# Patient Record
Sex: Male | Born: 1955 | Race: Black or African American | Hispanic: No | Marital: Married | Smoking: Never smoker
Health system: Southern US, Community
[De-identification: ages and names within clinical notes are randomized; demographics above are authoritative.]

## PROBLEM LIST (undated history)

## (undated) DIAGNOSIS — K219 Gastro-esophageal reflux disease without esophagitis: Secondary | ICD-10-CM

## (undated) DIAGNOSIS — M1990 Unspecified osteoarthritis, unspecified site: Secondary | ICD-10-CM

## (undated) DIAGNOSIS — M545 Low back pain, unspecified: Secondary | ICD-10-CM

## (undated) DIAGNOSIS — G8929 Other chronic pain: Secondary | ICD-10-CM

## (undated) DIAGNOSIS — H409 Unspecified glaucoma: Secondary | ICD-10-CM

## (undated) DIAGNOSIS — R7303 Prediabetes: Secondary | ICD-10-CM

## (undated) DIAGNOSIS — I499 Cardiac arrhythmia, unspecified: Secondary | ICD-10-CM

## (undated) DIAGNOSIS — D649 Anemia, unspecified: Secondary | ICD-10-CM

## (undated) DIAGNOSIS — C61 Malignant neoplasm of prostate: Secondary | ICD-10-CM

## (undated) DIAGNOSIS — G4730 Sleep apnea, unspecified: Secondary | ICD-10-CM

## (undated) DIAGNOSIS — I10 Essential (primary) hypertension: Secondary | ICD-10-CM

## (undated) HISTORY — PX: EYE SURGERY: SHX253

## (undated) HISTORY — PX: COLONOSCOPY: SHX174

---

## 1988-12-06 HISTORY — PX: VASECTOMY: SHX75

## 1988-12-06 HISTORY — PX: KNEE ARTHROSCOPY: SUR90

## 2012-04-07 HISTORY — PX: PROSTATECTOMY: SHX69

## 2013-06-06 ENCOUNTER — Other Ambulatory Visit: Payer: Self-pay | Admitting: Orthopedic Surgery

## 2013-06-06 ENCOUNTER — Encounter (HOSPITAL_COMMUNITY): Payer: Self-pay | Admitting: Pharmacy Technician

## 2013-06-09 ENCOUNTER — Encounter (HOSPITAL_COMMUNITY): Payer: Self-pay

## 2013-06-09 ENCOUNTER — Ambulatory Visit (HOSPITAL_COMMUNITY)
Admission: RE | Admit: 2013-06-09 | Discharge: 2013-06-09 | Disposition: A | Discharge status: Home / Self Care | Payer: Federal, State, Local not specified - PPO | Source: Ambulatory Visit | Attending: Orthopedic Surgery | Admitting: Orthopedic Surgery

## 2013-06-09 ENCOUNTER — Encounter (INDEPENDENT_AMBULATORY_CARE_PROVIDER_SITE_OTHER): Payer: Self-pay

## 2013-06-09 ENCOUNTER — Encounter (HOSPITAL_COMMUNITY)
Admission: RE | Admit: 2013-06-09 | Discharge: 2013-06-09 | Disposition: A | Discharge status: Home / Self Care | Payer: Federal, State, Local not specified - PPO | Source: Ambulatory Visit | Attending: Orthopedic Surgery | Admitting: Orthopedic Surgery

## 2013-06-09 ENCOUNTER — Other Ambulatory Visit (HOSPITAL_COMMUNITY): Payer: Self-pay | Admitting: *Deleted

## 2013-06-09 DIAGNOSIS — Z01812 Encounter for preprocedural laboratory examination: Secondary | ICD-10-CM | POA: Insufficient documentation

## 2013-06-09 DIAGNOSIS — Z01810 Encounter for preprocedural cardiovascular examination: Secondary | ICD-10-CM | POA: Insufficient documentation

## 2013-06-09 DIAGNOSIS — Z01818 Encounter for other preprocedural examination: Secondary | ICD-10-CM | POA: Insufficient documentation

## 2013-06-09 HISTORY — DX: Essential (primary) hypertension: I10

## 2013-06-09 HISTORY — DX: Cardiac arrhythmia, unspecified: I49.9

## 2013-06-09 HISTORY — DX: Unspecified osteoarthritis, unspecified site: M19.90

## 2013-06-09 HISTORY — DX: Gastro-esophageal reflux disease without esophagitis: K21.9

## 2013-06-09 HISTORY — DX: Sleep apnea, unspecified: G47.30

## 2013-06-09 LAB — CBC WITH DIFFERENTIAL/PLATELET
Basophils Absolute: 0 10*3/uL (ref 0.0–0.1)
Basophils Relative: 0 % (ref 0–1)
Eosinophils Absolute: 0.1 10*3/uL (ref 0.0–0.7)
Eosinophils Relative: 1 % (ref 0–5)
HCT: 45.1 % (ref 39.0–52.0)
Hemoglobin: 15.2 g/dL (ref 13.0–17.0)
LYMPHS ABS: 1.6 10*3/uL (ref 0.7–4.0)
LYMPHS PCT: 33 % (ref 12–46)
MCH: 30.1 pg (ref 26.0–34.0)
MCHC: 33.7 g/dL (ref 30.0–36.0)
MCV: 89.3 fL (ref 78.0–100.0)
Monocytes Absolute: 0.5 10*3/uL (ref 0.1–1.0)
Monocytes Relative: 10 % (ref 3–12)
NEUTROS PCT: 56 % (ref 43–77)
Neutro Abs: 2.7 10*3/uL (ref 1.7–7.7)
PLATELETS: 231 10*3/uL (ref 150–400)
RBC: 5.05 MIL/uL (ref 4.22–5.81)
RDW: 14.7 % (ref 11.5–15.5)
WBC: 4.8 10*3/uL (ref 4.0–10.5)

## 2013-06-09 LAB — TYPE AND SCREEN
ABO/RH(D): A NEG
Antibody Screen: NEGATIVE

## 2013-06-09 LAB — URINALYSIS, ROUTINE W REFLEX MICROSCOPIC
BILIRUBIN URINE: NEGATIVE
Glucose, UA: NEGATIVE mg/dL
Hgb urine dipstick: NEGATIVE
Ketones, ur: NEGATIVE mg/dL
LEUKOCYTES UA: NEGATIVE
NITRITE: NEGATIVE
PH: 7.5 (ref 5.0–8.0)
PROTEIN: NEGATIVE mg/dL
Specific Gravity, Urine: 1.025 (ref 1.005–1.030)
UROBILINOGEN UA: 0.2 mg/dL (ref 0.0–1.0)

## 2013-06-09 LAB — COMPREHENSIVE METABOLIC PANEL
ALK PHOS: 47 U/L (ref 39–117)
ALT: 21 U/L (ref 0–53)
AST: 24 U/L (ref 0–37)
Albumin: 4.1 g/dL (ref 3.5–5.2)
BUN: 11 mg/dL (ref 6–23)
CO2: 27 meq/L (ref 19–32)
Calcium: 9.6 mg/dL (ref 8.4–10.5)
Chloride: 101 mEq/L (ref 96–112)
Creatinine, Ser: 0.93 mg/dL (ref 0.50–1.35)
GLUCOSE: 84 mg/dL (ref 70–99)
POTASSIUM: 3.5 meq/L — AB (ref 3.7–5.3)
Sodium: 141 mEq/L (ref 137–147)
Total Bilirubin: 0.3 mg/dL (ref 0.3–1.2)
Total Protein: 7.3 g/dL (ref 6.0–8.3)

## 2013-06-09 LAB — SURGICAL PCR SCREEN
MRSA, PCR: NEGATIVE
Staphylococcus aureus: NEGATIVE

## 2013-06-09 LAB — ABO/RH: ABO/RH(D): A NEG

## 2013-06-09 LAB — PROTIME-INR
INR: 0.96 (ref 0.00–1.49)
Prothrombin Time: 12.6 seconds (ref 11.6–15.2)

## 2013-06-09 LAB — APTT: aPTT: 34 seconds (ref 24–37)

## 2013-06-09 NOTE — Pre-Procedure Instructions (Signed)
Paul Padilla  06/09/2013   Your procedure is scheduled on:  Wednesday, June 15, 2013 at 8:30 AM.   Report to [facility] Entrance "A" Admitting Office at 6:30 AM.   Call this number if you have problems the morning of surgery: [phone]   Remember:   Do not eat food or drink liquids after midnight Tuesday, 06/14/13.   Take these medicines the morning of surgery with A SIP OF WATER: gabapentin (NEURONTIN)   Stop Celebrex as of today.   Do not wear jewelry.  Do not wear lotions, powders, or cologne. You may wear deodorant.  Men may shave face and neck.  Do not bring valuables to the hospital.  [facility] is not responsible                  for any belongings or valuables.               Contacts, dentures or bridgework may not be worn into surgery.  Leave suitcase in the car. After surgery it may be brought to your room.  For patients admitted to the hospital, discharge time is determined by your                treatment team.           Special Instructions: [facility] - Preparing for Surgery  Before surgery, you can play an important role.  Because skin is not sterile, your skin needs to be as free of germs as possible.  You can reduce the number of germs on you skin by washing with CHG (chlorahexidine gluconate) soap before surgery.  CHG is an antiseptic cleaner which kills germs and bonds with the skin to continue killing germs even after washing.  Please DO NOT use if you have an allergy to CHG or antibacterial soaps.  If your skin becomes reddened/irritated stop using the CHG and inform your nurse when you arrive at Short Stay.  Do not shave (including legs and underarms) for at least 48 hours prior to the first CHG shower.  You may shave your face.  Please follow these instructions carefully:   1.  Shower with CHG Soap the night before surgery and the                                morning of Surgery.  2.  If you choose to wash your hair, wash your hair first as  usual with your       normal shampoo.  3.  After you shampoo, rinse your hair and body thoroughly to remove the                      Shampoo.  4.  Use CHG as you would any other liquid soap.  You can apply chg directly       to the skin and wash gently with scrungie or a clean washcloth.  5.  Apply the CHG Soap to your body ONLY FROM THE NECK DOWN.        Do not use on open wounds or open sores.  Avoid contact with your eyes, ears, mouth and genitals (private parts).  Wash genitals (private parts) with your normal soap.  6.  Wash thoroughly, paying special attention to the area where your surgery        will be performed.  7.  Thoroughly rinse your body with warm  water from the neck down.  8.  DO NOT shower/wash with your normal soap after using and rinsing off       the CHG Soap.  9.  Pat yourself dry with a clean towel.            10.  Wear clean pajamas.            11.  Place clean sheets on your bed the night of your first shower and do not        sleep with pets.  Day of Surgery  Do not apply any lotions the morning of surgery.  Please wear clean clothes to the hospital/surgery center.     Please read over the following fact sheets that you were given: Pain Booklet, Coughing and Deep Breathing, Blood Transfusion Information, MRSA Information and Surgical Site Infection Prevention

## 2013-06-09 NOTE — Progress Notes (Signed)
Pt concerned about possibility of having to have a urinary catheter. He has had his prostate removed and has been told that having a catheter inserted would not be a problem, but he is still concerned.

## 2013-06-14 MED ORDER — CEFAZOLIN SODIUM-DEXTROSE 2-3 GM-% IV SOLR
2.0000 g | INTRAVENOUS | Status: AC
  Administered 2013-06-15 (×2): 2 g via INTRAVENOUS
  Filled 2013-06-14: qty 50

## 2013-06-15 ENCOUNTER — Inpatient Hospital Stay (HOSPITAL_COMMUNITY)
Admission: RE | Admit: 2013-06-15 | Discharge: 2013-06-18 | DRG: 460 | Disposition: A | Discharge status: Home / Self Care | Payer: Federal, State, Local not specified - PPO | Source: Ambulatory Visit | Attending: Orthopedic Surgery | Admitting: Orthopedic Surgery

## 2013-06-15 ENCOUNTER — Encounter (HOSPITAL_COMMUNITY): Payer: Self-pay | Admitting: Surgery

## 2013-06-15 ENCOUNTER — Inpatient Hospital Stay (HOSPITAL_COMMUNITY): Payer: Federal, State, Local not specified - PPO

## 2013-06-15 ENCOUNTER — Encounter (HOSPITAL_COMMUNITY)
Admission: RE | Disposition: A | Discharge status: Home / Self Care | Payer: Federal, State, Local not specified - PPO | Source: Ambulatory Visit | Attending: Orthopedic Surgery

## 2013-06-15 ENCOUNTER — Inpatient Hospital Stay (HOSPITAL_COMMUNITY): Payer: Federal, State, Local not specified - PPO | Admitting: Anesthesiology

## 2013-06-15 ENCOUNTER — Encounter (HOSPITAL_COMMUNITY): Payer: Federal, State, Local not specified - PPO | Admitting: Anesthesiology

## 2013-06-15 DIAGNOSIS — M48061 Spinal stenosis, lumbar region without neurogenic claudication: Principal | ICD-10-CM | POA: Diagnosis present

## 2013-06-15 DIAGNOSIS — G4730 Sleep apnea, unspecified: Secondary | ICD-10-CM | POA: Diagnosis present

## 2013-06-15 DIAGNOSIS — Z8042 Family history of malignant neoplasm of prostate: Secondary | ICD-10-CM

## 2013-06-15 DIAGNOSIS — Z8249 Family history of ischemic heart disease and other diseases of the circulatory system: Secondary | ICD-10-CM

## 2013-06-15 DIAGNOSIS — Q762 Congenital spondylolisthesis: Secondary | ICD-10-CM

## 2013-06-15 DIAGNOSIS — M129 Arthropathy, unspecified: Secondary | ICD-10-CM | POA: Diagnosis present

## 2013-06-15 DIAGNOSIS — M5410 Radiculopathy, site unspecified: Secondary | ICD-10-CM | POA: Diagnosis present

## 2013-06-15 DIAGNOSIS — I10 Essential (primary) hypertension: Secondary | ICD-10-CM | POA: Diagnosis present

## 2013-06-15 DIAGNOSIS — Z8546 Personal history of malignant neoplasm of prostate: Secondary | ICD-10-CM

## 2013-06-15 DIAGNOSIS — K219 Gastro-esophageal reflux disease without esophagitis: Secondary | ICD-10-CM | POA: Diagnosis present

## 2013-06-15 HISTORY — DX: Malignant neoplasm of prostate: C61

## 2013-06-15 HISTORY — DX: Other chronic pain: G89.29

## 2013-06-15 HISTORY — DX: Low back pain, unspecified: M54.50

## 2013-06-15 HISTORY — DX: Low back pain: M54.5

## 2013-06-15 HISTORY — PX: POSTERIOR LAMINECTOMY / DECOMPRESSION LUMBAR SPINE: SUR740

## 2013-06-15 SURGERY — POSTERIOR LUMBAR FUSION 1 LEVEL
Anesthesia: General | Site: Spine Lumbar | Laterality: Left

## 2013-06-15 MED ORDER — BUPIVACAINE-EPINEPHRINE 0.25% -1:200000 IJ SOLN
INTRAMUSCULAR | Status: DC | PRN
  Administered 2013-06-15: 25 mL

## 2013-06-15 MED ORDER — METHYLENE BLUE 1 % INJ SOLN
INTRAMUSCULAR | Status: AC
Start: 2013-06-15 — End: 2013-06-15
  Filled 2013-06-15: qty 10

## 2013-06-15 MED ORDER — BUPIVACAINE-EPINEPHRINE (PF) 0.25% -1:200000 IJ SOLN
INTRAMUSCULAR | Status: AC
  Filled 2013-06-15: qty 30

## 2013-06-15 MED ORDER — FENTANYL CITRATE 0.05 MG/ML IJ SOLN
INTRAMUSCULAR | Status: DC | PRN
  Administered 2013-06-15 (×4): 50 ug via INTRAVENOUS
  Administered 2013-06-15: 150 ug via INTRAVENOUS

## 2013-06-15 MED ORDER — MIDAZOLAM HCL 5 MG/5ML IJ SOLN
INTRAMUSCULAR | Status: DC | PRN
  Administered 2013-06-15: 2 mg via INTRAVENOUS

## 2013-06-15 MED ORDER — 0.9 % SODIUM CHLORIDE (POUR BTL) OPTIME
TOPICAL | Status: DC | PRN
  Administered 2013-06-15 (×4): 1000 mL

## 2013-06-15 MED ORDER — ARTIFICIAL TEARS OP OINT
TOPICAL_OINTMENT | OPHTHALMIC | Status: DC | PRN
  Administered 2013-06-15: 1 via OPHTHALMIC

## 2013-06-15 MED ORDER — PHENYLEPHRINE HCL 10 MG/ML IJ SOLN
INTRAMUSCULAR | Status: DC | PRN
  Administered 2013-06-15 (×5): 80 ug via INTRAVENOUS

## 2013-06-15 MED ORDER — LIDOCAINE HCL (CARDIAC) 20 MG/ML IV SOLN
INTRAVENOUS | Status: AC
  Filled 2013-06-15: qty 5

## 2013-06-15 MED ORDER — SENNOSIDES-DOCUSATE SODIUM 8.6-50 MG PO TABS
1.0000 | ORAL_TABLET | Freq: Every evening | ORAL | Status: DC | PRN
  Administered 2013-06-16 – 2013-06-17 (×2): 1 via ORAL
  Filled 2013-06-15 (×2): qty 1

## 2013-06-15 MED ORDER — NEOSTIGMINE METHYLSULFATE 1 MG/ML IJ SOLN
INTRAMUSCULAR | Status: DC | PRN
  Administered 2013-06-15: 2 mg via INTRAVENOUS

## 2013-06-15 MED ORDER — THROMBIN 20000 UNITS EX SOLR
CUTANEOUS | Status: DC | PRN
  Administered 2013-06-15: 10:00:00

## 2013-06-15 MED ORDER — SODIUM CHLORIDE 0.9 % IJ SOLN
9.0000 mL | INTRAMUSCULAR | Status: DC | PRN
Start: 2013-06-15 — End: 2013-06-16

## 2013-06-15 MED ORDER — ONDANSETRON HCL 4 MG/2ML IJ SOLN
INTRAMUSCULAR | Status: AC
  Filled 2013-06-15: qty 2

## 2013-06-15 MED ORDER — AMLODIPINE BESYLATE 5 MG PO TABS
5.0000 mg | ORAL_TABLET | Freq: Every day | ORAL | Status: DC
  Administered 2013-06-16 – 2013-06-17 (×2): 5 mg via ORAL
  Filled 2013-06-15 (×3): qty 1

## 2013-06-15 MED ORDER — VECURONIUM BROMIDE 10 MG IV SOLR
INTRAVENOUS | Status: DC | PRN
  Administered 2013-06-15 (×2): 1 mg via INTRAVENOUS
  Administered 2013-06-15 (×2): 2 mg via INTRAVENOUS

## 2013-06-15 MED ORDER — HYDROMORPHONE HCL PF 1 MG/ML IJ SOLN
INTRAMUSCULAR | Status: AC
  Filled 2013-06-15: qty 1

## 2013-06-15 MED ORDER — LIDOCAINE HCL (CARDIAC) 20 MG/ML IV SOLN
INTRAVENOUS | Status: DC | PRN
  Administered 2013-06-15: 100 mg via INTRAVENOUS

## 2013-06-15 MED ORDER — OXYCODONE HCL 5 MG PO TABS
ORAL_TABLET | ORAL | Status: AC
  Administered 2013-06-15: 5 mg
  Filled 2013-06-15: qty 1

## 2013-06-15 MED ORDER — NALOXONE HCL 0.4 MG/ML IJ SOLN: 0.4000 mg | INTRAMUSCULAR | Status: DC | PRN

## 2013-06-15 MED ORDER — PROMETHAZINE HCL 25 MG/ML IJ SOLN: 6.2500 mg | INTRAMUSCULAR | Status: DC | PRN

## 2013-06-15 MED ORDER — DIAZEPAM 5 MG PO TABS
5.0000 mg | ORAL_TABLET | Freq: Four times a day (QID) | ORAL | Status: DC | PRN
  Administered 2013-06-15 – 2013-06-16 (×3): 5 mg via ORAL
  Filled 2013-06-15 (×3): qty 1

## 2013-06-15 MED ORDER — CEFAZOLIN SODIUM 1-5 GM-% IV SOLN
1.0000 g | Freq: Three times a day (TID) | INTRAVENOUS | Status: AC
  Administered 2013-06-15 – 2013-06-16 (×2): 1 g via INTRAVENOUS
  Filled 2013-06-15 (×2): qty 50

## 2013-06-15 MED ORDER — HYDROCHLOROTHIAZIDE 12.5 MG PO CAPS
12.5000 mg | ORAL_CAPSULE | Freq: Every day | ORAL | Status: DC
  Administered 2013-06-16 – 2013-06-18 (×3): 12.5 mg via ORAL
  Filled 2013-06-15 (×3): qty 1

## 2013-06-15 MED ORDER — SODIUM CHLORIDE 0.9 % IJ SOLN: 3.0000 mL | INTRAMUSCULAR | Status: DC | PRN

## 2013-06-15 MED ORDER — ROCURONIUM BROMIDE 100 MG/10ML IV SOLN
INTRAVENOUS | Status: DC | PRN
  Administered 2013-06-15: 50 mg via INTRAVENOUS

## 2013-06-15 MED ORDER — IRBESARTAN 150 MG PO TABS
150.0000 mg | ORAL_TABLET | Freq: Every day | ORAL | Status: DC
Start: 2013-06-16 — End: 2013-06-18
  Administered 2013-06-16 – 2013-06-18 (×3): 150 mg via ORAL
  Filled 2013-06-15 (×3): qty 1

## 2013-06-15 MED ORDER — PROPOFOL 10 MG/ML IV BOLUS
INTRAVENOUS | Status: AC
  Filled 2013-06-15: qty 20

## 2013-06-15 MED ORDER — MIDAZOLAM HCL 2 MG/2ML IJ SOLN
INTRAMUSCULAR | Status: AC
  Filled 2013-06-15: qty 2

## 2013-06-15 MED ORDER — GABAPENTIN 300 MG PO CAPS
300.0000 mg | ORAL_CAPSULE | Freq: Two times a day (BID) | ORAL | Status: DC
  Administered 2013-06-15 – 2013-06-18 (×6): 300 mg via ORAL
  Filled 2013-06-15 (×7): qty 1

## 2013-06-15 MED ORDER — MENTHOL 3 MG MT LOZG: 1.0000 | LOZENGE | OROMUCOSAL | Status: DC | PRN

## 2013-06-15 MED ORDER — PHENYLEPHRINE 40 MCG/ML (10ML) SYRINGE FOR IV PUSH (FOR BLOOD PRESSURE SUPPORT)
PREFILLED_SYRINGE | INTRAVENOUS | Status: AC
  Filled 2013-06-15: qty 10

## 2013-06-15 MED ORDER — LACTATED RINGERS IV SOLN
INTRAVENOUS | Status: DC | PRN
  Administered 2013-06-15: 09:00:00 via INTRAVENOUS

## 2013-06-15 MED ORDER — GLYCOPYRROLATE 0.2 MG/ML IJ SOLN
INTRAMUSCULAR | Status: AC
  Filled 2013-06-15: qty 1

## 2013-06-15 MED ORDER — OXYCODONE HCL 5 MG/5ML PO SOLN: 5.0000 mg | Freq: Once | ORAL | Status: DC | PRN

## 2013-06-15 MED ORDER — METHYLENE BLUE 1 % INJ SOLN
INTRAMUSCULAR | Status: DC | PRN
  Administered 2013-06-15: .5 mL

## 2013-06-15 MED ORDER — ALBUMIN HUMAN 5 % IV SOLN
INTRAVENOUS | Status: DC | PRN
  Administered 2013-06-15: 11:00:00 via INTRAVENOUS

## 2013-06-15 MED ORDER — SODIUM CHLORIDE 0.9 % IV SOLN
INTRAVENOUS | Status: DC | PRN
  Administered 2013-06-15: 12:00:00 via INTRAVENOUS

## 2013-06-15 MED ORDER — GLYCOPYRROLATE 0.2 MG/ML IJ SOLN
INTRAMUSCULAR | Status: DC | PRN
  Administered 2013-06-15: 0.3 mg via INTRAVENOUS

## 2013-06-15 MED ORDER — DIPHENHYDRAMINE HCL 50 MG/ML IJ SOLN: 12.5000 mg | Freq: Four times a day (QID) | INTRAMUSCULAR | Status: DC | PRN

## 2013-06-15 MED ORDER — FENTANYL CITRATE 0.05 MG/ML IJ SOLN
INTRAMUSCULAR | Status: AC
  Filled 2013-06-15: qty 5

## 2013-06-15 MED ORDER — MORPHINE SULFATE 2 MG/ML IJ SOLN
1.0000 mg | INTRAMUSCULAR | Status: DC | PRN
  Administered 2013-06-16: 2 mg via INTRAVENOUS
  Filled 2013-06-15: qty 1

## 2013-06-15 MED ORDER — DIPHENHYDRAMINE HCL 12.5 MG/5ML PO ELIX: 12.5000 mg | ORAL_SOLUTION | Freq: Four times a day (QID) | ORAL | Status: DC | PRN

## 2013-06-15 MED ORDER — SODIUM CHLORIDE 0.9 % IV SOLN: 250.0000 mL | INTRAVENOUS | Status: DC

## 2013-06-15 MED ORDER — ALUM & MAG HYDROXIDE-SIMETH 200-200-20 MG/5ML PO SUSP: 30.0000 mL | Freq: Four times a day (QID) | ORAL | Status: DC | PRN

## 2013-06-15 MED ORDER — PHENOL 1.4 % MT LIQD: 1.0000 | OROMUCOSAL | Status: DC | PRN

## 2013-06-15 MED ORDER — MORPHINE SULFATE (PF) 1 MG/ML IV SOLN
INTRAVENOUS | Status: DC
  Administered 2013-06-15: 7.5 mg via INTRAVENOUS
  Administered 2013-06-15: 15:00:00 via INTRAVENOUS
  Administered 2013-06-16: 6 mg via INTRAVENOUS
  Administered 2013-06-16: 01:00:00 via INTRAVENOUS
  Administered 2013-06-16: 14.71 mg via INTRAVENOUS
  Filled 2013-06-15: qty 25

## 2013-06-15 MED ORDER — POVIDONE-IODINE 7.5 % EX SOLN: Freq: Once | CUTANEOUS | Status: DC

## 2013-06-15 MED ORDER — ARTIFICIAL TEARS OP OINT
TOPICAL_OINTMENT | OPHTHALMIC | Status: AC
  Filled 2013-06-15: qty 3.5

## 2013-06-15 MED ORDER — PROPOFOL 10 MG/ML IV BOLUS
INTRAVENOUS | Status: DC | PRN
  Administered 2013-06-15: 200 mg via INTRAVENOUS
  Administered 2013-06-15: 20 mg via INTRAVENOUS

## 2013-06-15 MED ORDER — HYDROMORPHONE HCL PF 1 MG/ML IJ SOLN
0.2500 mg | INTRAMUSCULAR | Status: DC | PRN
  Administered 2013-06-15 (×2): 0.5 mg via INTRAVENOUS

## 2013-06-15 MED ORDER — SODIUM CHLORIDE 0.9 % IV SOLN
INTRAVENOUS | Status: DC
  Administered 2013-06-15 – 2013-06-17 (×2): via INTRAVENOUS

## 2013-06-15 MED ORDER — SUCCINYLCHOLINE CHLORIDE 20 MG/ML IJ SOLN
INTRAMUSCULAR | Status: AC
  Filled 2013-06-15: qty 1

## 2013-06-15 MED ORDER — PROPOFOL INFUSION 10 MG/ML OPTIME
INTRAVENOUS | Status: DC | PRN
  Administered 2013-06-15: 50 ug/kg/min via INTRAVENOUS

## 2013-06-15 MED ORDER — ONDANSETRON HCL 4 MG/2ML IJ SOLN: 4.0000 mg | Freq: Four times a day (QID) | INTRAMUSCULAR | Status: DC | PRN

## 2013-06-15 MED ORDER — ACETAMINOPHEN 325 MG PO TABS
650.0000 mg | ORAL_TABLET | ORAL | Status: DC | PRN
  Administered 2013-06-17 – 2013-06-18 (×5): 650 mg via ORAL
  Filled 2013-06-15 (×5): qty 2

## 2013-06-15 MED ORDER — DOCUSATE SODIUM 100 MG PO CAPS
100.0000 mg | ORAL_CAPSULE | Freq: Two times a day (BID) | ORAL | Status: DC
  Administered 2013-06-15 – 2013-06-18 (×6): 100 mg via ORAL
  Filled 2013-06-15 (×7): qty 1

## 2013-06-15 MED ORDER — STERILE WATER FOR INJECTION IJ SOLN
INTRAMUSCULAR | Status: AC
  Filled 2013-06-15: qty 10

## 2013-06-15 MED ORDER — ROCURONIUM BROMIDE 50 MG/5ML IV SOLN
INTRAVENOUS | Status: AC
  Filled 2013-06-15: qty 1

## 2013-06-15 MED ORDER — PHENYLEPHRINE HCL 10 MG/ML IJ SOLN
10.0000 mg | INTRAVENOUS | Status: DC | PRN
  Administered 2013-06-15: 25 ug/min via INTRAVENOUS

## 2013-06-15 MED ORDER — MORPHINE SULFATE (PF) 1 MG/ML IV SOLN
INTRAVENOUS | Status: AC
  Filled 2013-06-15: qty 25

## 2013-06-15 MED ORDER — THROMBIN 20000 UNITS EX SOLR
CUTANEOUS | Status: AC
  Filled 2013-06-15: qty 20000

## 2013-06-15 MED ORDER — SODIUM CHLORIDE 0.9 % IJ SOLN
INTRAMUSCULAR | Status: AC
  Filled 2013-06-15: qty 10

## 2013-06-15 MED ORDER — ONDANSETRON HCL 4 MG/2ML IJ SOLN
INTRAMUSCULAR | Status: DC | PRN
  Administered 2013-06-15: 4 mg via INTRAVENOUS

## 2013-06-15 MED ORDER — FLEET ENEMA 7-19 GM/118ML RE ENEM: 1.0000 | ENEMA | Freq: Once | RECTAL | Status: AC | PRN

## 2013-06-15 MED ORDER — EPHEDRINE SULFATE 50 MG/ML IJ SOLN
INTRAMUSCULAR | Status: AC
  Filled 2013-06-15: qty 1

## 2013-06-15 MED ORDER — OXYCODONE-ACETAMINOPHEN 5-325 MG PO TABS
1.0000 | ORAL_TABLET | ORAL | Status: DC | PRN
  Administered 2013-06-16 – 2013-06-17 (×2): 2 via ORAL
  Administered 2013-06-18: 1 via ORAL
  Filled 2013-06-15: qty 2
  Filled 2013-06-15: qty 1
  Filled 2013-06-15: qty 2
  Filled 2013-06-15: qty 1
  Filled 2013-06-15: qty 2

## 2013-06-15 MED ORDER — ONDANSETRON HCL 4 MG/2ML IJ SOLN: 4.0000 mg | INTRAMUSCULAR | Status: DC | PRN

## 2013-06-15 MED ORDER — LACTATED RINGERS IV SOLN
INTRAVENOUS | Status: DC | PRN
  Administered 2013-06-15 (×2): via INTRAVENOUS

## 2013-06-15 MED ORDER — OLMESARTAN-AMLODIPINE-HCTZ 20-5-12.5 MG PO TABS: 1.0000 | ORAL_TABLET | Freq: Every day | ORAL | Status: DC

## 2013-06-15 MED ORDER — OXYCODONE HCL 5 MG PO TABS: 5.0000 mg | ORAL_TABLET | Freq: Once | ORAL | Status: DC | PRN

## 2013-06-15 MED ORDER — ACETAMINOPHEN 650 MG RE SUPP: 650.0000 mg | RECTAL | Status: DC | PRN

## 2013-06-15 MED ORDER — VECURONIUM BROMIDE 10 MG IV SOLR
INTRAVENOUS | Status: AC
  Filled 2013-06-15: qty 10

## 2013-06-15 MED ORDER — BISACODYL 5 MG PO TBEC
5.0000 mg | DELAYED_RELEASE_TABLET | Freq: Every day | ORAL | Status: DC | PRN
  Administered 2013-06-16: 5 mg via ORAL
  Filled 2013-06-15: qty 1

## 2013-06-15 MED ORDER — SODIUM CHLORIDE 0.9 % IJ SOLN
3.0000 mL | Freq: Two times a day (BID) | INTRAMUSCULAR | Status: DC
  Administered 2013-06-15 – 2013-06-17 (×3): 3 mL via INTRAVENOUS

## 2013-06-15 SURGICAL SUPPLY — 78 items
BENZOIN TINCTURE PRP APPL 2/3 (GAUZE/BANDAGES/DRESSINGS) ×3 IMPLANT
BLADE SURG ROTATE 9660 (MISCELLANEOUS) ×3 IMPLANT
BUR ROUND PRECISION 4.0 (BURR) ×2 IMPLANT
BUR ROUND PRECISION 4.0MM (BURR) ×1
CAGE CONCORDE BULLET 9X11X27 (Cage) ×2 IMPLANT
CAGE SPNL PRLL BLT NOSE 27X9 (Cage) ×1 IMPLANT
CARTRIDGE OIL MAESTRO DRILL (MISCELLANEOUS) ×2 IMPLANT
CLOSURE WOUND 1/2 X4 (GAUZE/BANDAGES/DRESSINGS) ×1
CONT SPEC STER OR (MISCELLANEOUS) ×3 IMPLANT
CORDS BIPOLAR (ELECTRODE) ×3 IMPLANT
COVER SURGICAL LIGHT HANDLE (MISCELLANEOUS) ×3 IMPLANT
DIFFUSER DRILL AIR PNEUMATIC (MISCELLANEOUS) ×6 IMPLANT
DRAIN CHANNEL 15F RND FF W/TCR (WOUND CARE) IMPLANT
DRAPE C-ARM 42X72 X-RAY (DRAPES) ×3 IMPLANT
DRAPE ORTHO SPLIT 77X108 STRL (DRAPES) ×2
DRAPE POUCH INSTRU U-SHP 10X18 (DRAPES) ×3 IMPLANT
DRAPE SURG 17X23 STRL (DRAPES) ×9 IMPLANT
DRAPE SURG ORHT 6 SPLT 77X108 (DRAPES) ×1 IMPLANT
DURAPREP 26ML APPLICATOR (WOUND CARE) ×3 IMPLANT
ELECT BLADE 4.0 EZ CLEAN MEGAD (MISCELLANEOUS) ×3
ELECT CAUTERY BLADE 6.4 (BLADE) ×3 IMPLANT
ELECT REM PT RETURN 9FT ADLT (ELECTROSURGICAL) ×3
ELECTRODE BLDE 4.0 EZ CLN MEGD (MISCELLANEOUS) ×1 IMPLANT
ELECTRODE REM PT RTRN 9FT ADLT (ELECTROSURGICAL) ×1 IMPLANT
EVACUATOR SILICONE 100CC (DRAIN) IMPLANT
GAUZE SPONGE 4X4 16PLY XRAY LF (GAUZE/BANDAGES/DRESSINGS) ×12 IMPLANT
GLOVE BIO SURGEON STRL SZ7 (GLOVE) ×3 IMPLANT
GLOVE BIO SURGEON STRL SZ8 (GLOVE) ×3 IMPLANT
GLOVE BIOGEL PI IND STRL 7.5 (GLOVE) ×1 IMPLANT
GLOVE BIOGEL PI IND STRL 8 (GLOVE) ×1 IMPLANT
GLOVE BIOGEL PI INDICATOR 7.5 (GLOVE) ×2
GLOVE BIOGEL PI INDICATOR 8 (GLOVE) ×2
GOWN STRL REUS W/ TWL LRG LVL3 (GOWN DISPOSABLE) ×2 IMPLANT
GOWN STRL REUS W/ TWL XL LVL3 (GOWN DISPOSABLE) ×1 IMPLANT
GOWN STRL REUS W/TWL LRG LVL3 (GOWN DISPOSABLE) ×4
GOWN STRL REUS W/TWL XL LVL3 (GOWN DISPOSABLE) ×2
IV CATH 14GX2 1/4 (CATHETERS) ×3 IMPLANT
KIT BASIN OR (CUSTOM PROCEDURE TRAY) ×3 IMPLANT
KIT POSITION SURG JACKSON T1 (MISCELLANEOUS) ×3 IMPLANT
KIT ROOM TURNOVER OR (KITS) ×3 IMPLANT
MARKER SKIN DUAL TIP RULER LAB (MISCELLANEOUS) ×3 IMPLANT
MIX DBX 10CC 35% BONE (Bone Implant) ×3 IMPLANT
NEEDLE BONE MARROW 8GX6 FENEST (NEEDLE) IMPLANT
NEEDLE HYPO 25GX1X1/2 BEV (NEEDLE) ×3 IMPLANT
NEEDLE SPNL 18GX3.5 QUINCKE PK (NEEDLE) ×6 IMPLANT
NEURO MONITORING STIM (LABOR (TRAVEL & OVERTIME)) ×3 IMPLANT
NS IRRIG 1000ML POUR BTL (IV SOLUTION) ×3 IMPLANT
OIL CARTRIDGE MAESTRO DRILL (MISCELLANEOUS) ×6
PACK LAMINECTOMY ORTHO (CUSTOM PROCEDURE TRAY) ×3 IMPLANT
PACK UNIVERSAL I (CUSTOM PROCEDURE TRAY) ×3 IMPLANT
PAD ARMBOARD 7.5X6 YLW CONV (MISCELLANEOUS) ×6 IMPLANT
PATTIES SURGICAL .5 X1 (DISPOSABLE) ×3 IMPLANT
PATTIES SURGICAL .5X1.5 (GAUZE/BANDAGES/DRESSINGS) ×3 IMPLANT
ROD PRE BENT EXP 40MM (Rod) ×6 IMPLANT
SCREW EXPEDIUM POLYAXIAL 7X40M (Screw) ×3 IMPLANT
SCREW POLYAXIAL 7X45MM (Screw) ×9 IMPLANT
SCREW SET SINGLE INNER (Screw) ×12 IMPLANT
SPONGE GAUZE 4X4 12PLY (GAUZE/BANDAGES/DRESSINGS) ×3 IMPLANT
SPONGE INTESTINAL PEANUT (DISPOSABLE) ×3 IMPLANT
SPONGE SURGIFOAM ABS GEL 100 (HEMOSTASIS) ×3 IMPLANT
STRIP CLOSURE SKIN 1/2X4 (GAUZE/BANDAGES/DRESSINGS) ×2 IMPLANT
SURGIFLO TRUKIT (HEMOSTASIS) IMPLANT
SUT MNCRL AB 4-0 PS2 18 (SUTURE) ×6 IMPLANT
SUT VIC AB 0 CT1 18XCR BRD 8 (SUTURE) ×1 IMPLANT
SUT VIC AB 0 CT1 8-18 (SUTURE) ×2
SUT VIC AB 1 CT1 18XCR BRD 8 (SUTURE) ×2 IMPLANT
SUT VIC AB 1 CT1 8-18 (SUTURE) ×4
SUT VIC AB 2-0 CT2 18 VCP726D (SUTURE) ×3 IMPLANT
SYR 20CC LL (SYRINGE) ×3 IMPLANT
SYR BULB IRRIGATION 50ML (SYRINGE) ×3 IMPLANT
SYR CONTROL 10ML LL (SYRINGE) ×3 IMPLANT
SYR TB 1ML LUER SLIP (SYRINGE) ×3 IMPLANT
TAPE CLOTH SURG 4X10 WHT LF (GAUZE/BANDAGES/DRESSINGS) ×3 IMPLANT
TOWEL OR 17X24 6PK STRL BLUE (TOWEL DISPOSABLE) ×3 IMPLANT
TOWEL OR 17X26 10 PK STRL BLUE (TOWEL DISPOSABLE) ×3 IMPLANT
TRAY FOLEY CATH 16FRSI W/METER (SET/KITS/TRAYS/PACK) ×3 IMPLANT
WATER STERILE IRR 1000ML POUR (IV SOLUTION) ×3 IMPLANT
YANKAUER SUCT BULB TIP NO VENT (SUCTIONS) ×3 IMPLANT

## 2013-06-15 NOTE — Anesthesia Postprocedure Evaluation (Signed)
  Anesthesia Post-op Note  Patient: Paul Padilla  Procedure(s) Performed: Procedure(s) with comments: POSTERIOR LUMBAR FUSION 1 LEVEL (Left) - Left sided lumbar 4-5 transforaminal lumbar interbody fusion with instrumentation, allograft  Patient Location: PACU  Anesthesia Type:General  Level of Consciousness: awake, alert  and oriented  Airway and Oxygen Therapy: Patient Spontanous Breathing  Post-op Pain: mild  Post-op Assessment: Post-op Vital signs reviewed  Post-op Vital Signs: Reviewed  Complications: No apparent anesthesia complications

## 2013-06-15 NOTE — H&P (Signed)
PREOPERATIVE H&P  Chief Complaint: left leg pain  HPI: Paul Padilla is a 58 y.o. male who presents with ongoing pain in the left leg. MRI = slippage and NF stenosis at L4/5. Patient has failed multiple forms of conservative care and does with to proceed with surgery.  Past Medical History  Diagnosis Date  . Hypertension   . Low back pain   . Dysrhythmia     irregular heartrate  . Sleep apnea     used to use CPAP, does not use at present time  . GERD (gastroesophageal reflux disease)   . Cancer     prostate cancer  . Arthritis    Past Surgical History  Procedure Laterality Date  . Prostatectomy      for prostate surgery  . Knee arthroscopy Left   . Colonoscopy    . Vasectomy     History   Social History  . Marital Status: Married    Spouse Name: N/A    Number of Children: N/A  . Years of Education: N/A   Social History Main Topics  . Smoking status: Never Smoker   . Smokeless tobacco: Never Used  . Alcohol Use: No  . Drug Use: No  . Sexual Activity: None   Other Topics Concern  . None   Social History Narrative  . None   Family History  Problem Relation Age of Onset  . Hypertension Mother   . Glaucoma Mother   . Cataracts Mother   . Heart disease Father   . Heart disease Brother   . Prostate cancer Brother    No Known Allergies Prior to Admission medications   Medication Sig Start Date End Date Taking? Authorizing Provider  celecoxib (CELEBREX) 200 MG capsule Take 200 mg by mouth daily as needed (back pain).   Yes Historical Provider, MD  Chlorphen-Phenyleph-ASA (ALKA-SELTZER PLUS COLD PO) Take by mouth as needed (took one time on 06/05/13).   Yes Historical Provider, MD  DM-Doxylamine-Acetaminophen (NYQUIL COLD & FLU PO) Take by mouth as needed (took one dose on 06/05/13).   Yes Historical Provider, MD  gabapentin (NEURONTIN) 300 MG capsule Take 300 mg by mouth 2 (two) times daily. Back pain   Yes Historical Provider, MD  Olmesartan-Amlodipine-HCTZ  (TRIBENZOR) 20-5-12.5 MG TABS Take 1 tablet by mouth daily.   Yes Historical Provider, MD     All other systems have been reviewed and were otherwise negative with the exception of those mentioned in the HPI and as above.  Physical Exam: Filed Vitals:   06/15/13 0630  BP: 161/92  Pulse: 85  Temp: 98.1 F (36.7 C)  Resp: 18    General: Alert, no acute distress Cardiovascular: No pedal edema Respiratory: No cyanosis, no use of accessory musculature GI: No organomegaly, abdomen is soft and non-tender Skin: No lesions in the area of chief complaint Neurologic: Sensation intact distally Psychiatric: Patient is competent for consent with normal mood and affect Lymphatic: No axillary or cervical lymphadenopathy  MUSCULOSKELETAL: + SLR on left  Assessment/Plan: Left leg pain  Plan for Procedure(s): POSTERIOR LUMBAR DECOMPRESSION AND FUSION 1 LEVEL, L4/5   Sinclair Ship, MD 06/15/2013 8:22 AM

## 2013-06-15 NOTE — Anesthesia Preprocedure Evaluation (Addendum)
Anesthesia Evaluation  Patient identified by MRN, date of birth, ID band Patient awake    Reviewed: Allergy & Precautions, H&P , NPO status , Patient's Chart, lab work & pertinent test results  History of Anesthesia Complications Negative for: history of anesthetic complications  Airway Mallampati: I TM Distance: >3 FB Neck ROM: Full    Dental  (+) Teeth Intact, Dental Advisory Given   Pulmonary sleep apnea ,  breath sounds clear to auscultation  Pulmonary exam normal       Cardiovascular hypertension, Pt. on medications + dysrhythmias Rhythm:Regular Rate:Normal     Neuro/Psych negative psych ROS   GI/Hepatic Neg liver ROS, GERD-  Medicated and Controlled,  Endo/Other  negative endocrine ROS  Renal/GU negative Renal ROS     Musculoskeletal   Abdominal   Peds  Hematology   Anesthesia Other Findings   Reproductive/Obstetrics negative OB ROS                         Anesthesia Physical Anesthesia Plan  ASA: II  Anesthesia Plan: General   Post-op Pain Management:    Induction: Intravenous  Airway Management Planned: Oral ETT  Additional Equipment:   Intra-op Plan:   Post-operative Plan: Extubation in OR  Informed Consent: I have reviewed the patients History and Physical, chart, labs and discussed the procedure including the risks, benefits and alternatives for the proposed anesthesia with the patient or authorized representative who has indicated his/her understanding and acceptance.   Dental advisory given  Plan Discussed with: CRNA, Anesthesiologist and Surgeon  Anesthesia Plan Comments:        Anesthesia Quick Evaluation

## 2013-06-15 NOTE — Anesthesia Procedure Notes (Signed)
Procedure Name: Intubation Date/Time: 06/15/2013 8:35 AM Performed by: Jenne Campus Pre-anesthesia Checklist: Patient identified, Emergency Drugs available, Suction available, Patient being monitored and Timeout performed Patient Re-evaluated:Patient Re-evaluated prior to inductionOxygen Delivery Method: Circle system utilized Preoxygenation: Pre-oxygenation with 100% oxygen Intubation Type: IV induction Ventilation: Mask ventilation without difficulty and Oral airway inserted - appropriate to patient size Laryngoscope Size: Miller and 3 Grade View: Grade I Tube type: Oral Tube size: 7.5 mm Number of attempts: 1 Airway Equipment and Method: Stylet Placement Confirmation: ETT inserted through vocal cords under direct vision,  positive ETCO2,  CO2 detector and breath sounds checked- equal and bilateral Secured at: 23 cm Tube secured with: Tape Dental Injury: Teeth and Oropharynx as per pre-operative assessment

## 2013-06-15 NOTE — Progress Notes (Signed)
Utilization review completed.  

## 2013-06-15 NOTE — Transfer of Care (Signed)
Immediate Anesthesia Transfer of Care Note  Patient: Paul Padilla  Procedure(s) Performed: Procedure(s) with comments: POSTERIOR LUMBAR FUSION 1 LEVEL (Left) - Left sided lumbar 4-5 transforaminal lumbar interbody fusion with instrumentation, allograft  Patient Location: PACU  Anesthesia Type:General  Level of Consciousness: oriented, sedated and patient cooperative  Airway & Oxygen Therapy: Patient Spontanous Breathing and Patient connected to face mask oxygen  Post-op Assessment: Report given to PACU RN and Post -op Vital signs reviewed and stable  Post vital signs: Reviewed  Complications: No apparent anesthesia complications

## 2013-06-15 NOTE — Preoperative (Signed)
Beta Blockers   Reason not to administer Beta Blockers:Not Applicable 

## 2013-06-16 ENCOUNTER — Encounter (HOSPITAL_COMMUNITY): Payer: Self-pay | Admitting: Orthopedic Surgery

## 2013-06-16 MED ORDER — BISACODYL 10 MG RE SUPP
10.0000 mg | Freq: Once | RECTAL | Status: AC
  Administered 2013-06-16: 10 mg via RECTAL

## 2013-06-16 MED ORDER — OXYCODONE HCL ER 20 MG PO T12A
20.0000 mg | EXTENDED_RELEASE_TABLET | Freq: Two times a day (BID) | ORAL | Status: DC
  Administered 2013-06-16: 20 mg via ORAL
  Filled 2013-06-16 (×2): qty 2

## 2013-06-16 MED ORDER — BISACODYL 10 MG RE SUPP
10.0000 mg | Freq: Once | RECTAL | Status: AC
  Administered 2013-06-16: 10 mg via RECTAL
  Filled 2013-06-16: qty 1

## 2013-06-16 MED ORDER — SODIUM CHLORIDE 0.9 % IV BOLUS (SEPSIS)
500.0000 mL | Freq: Once | INTRAVENOUS | Status: AC
  Administered 2013-06-16: 500 mL via INTRAVENOUS

## 2013-06-16 MED ORDER — SODIUM CHLORIDE 0.9 % IV BOLUS (SEPSIS)
250.0000 mL | Freq: Once | INTRAVENOUS | Status: AC
  Administered 2013-06-16: 250 mL via INTRAVENOUS

## 2013-06-16 MED ORDER — METHOCARBAMOL 100 MG/ML IJ SOLN
500.0000 mg | Freq: Three times a day (TID) | INTRAVENOUS | Status: DC
  Administered 2013-06-16: 500 mg via INTRAVENOUS
  Filled 2013-06-16 (×7): qty 5

## 2013-06-16 MED FILL — Sodium Chloride Irrigation Soln 0.9%: Qty: 1000 | Status: AC

## 2013-06-16 MED FILL — Sodium Chloride IV Soln 0.9%: INTRAVENOUS | Qty: 1000 | Status: AC

## 2013-06-16 MED FILL — Heparin Sodium (Porcine) Inj 1000 Unit/ML: INTRAMUSCULAR | Qty: 30 | Status: AC

## 2013-06-16 NOTE — Evaluation (Signed)
Physical Therapy Evaluation Patient Details Name: Paul Padilla MRN: [id number] DOB: 1956/03/15 Today's Date: 06/16/2013 Time: 2585-2778 PT Time Calculation (min): 14 min  PT Assessment / Plan / Recommendation History of Present Illness  Left-sided L4-5 transforaminal lumbar interbody fusion. Right-sided L4-5 posterolateral fusion  Clinical Impression  Pt. Presents to PT in a lethargic state but able to open eyes and answer questions before going back to sleep.  Rn is aware of this lethargy.  Pt. Has dependencies in his functional mobility and gait and needs acute PT to address these and below issues.  I believe that once he is more alert, he will be able to progress and may not need any home PT services.  He will have 24 hour care for at least a week per wife.     PT Assessment  Patient needs continued PT services    Follow Up Recommendations  No PT follow up;Supervision/Assistance - 24 hour;Supervision for mobility/OOB (as long as progress is sufficient)    Does the patient have the potential to tolerate intense rehabilitation      Barriers to Discharge        Equipment Recommendations  Rolling walker with 5" wheels (if not available to borrow )    Recommendations for Other Services     Frequency Min 6X/week    Precautions / Restrictions Precautions Precautions: Back Precaution Booklet Issued: No Precaution Comments: pt. too drowsy to receive back precaution education Restrictions Weight Bearing Restrictions: No   Pertinent Vitals/Pain See vitals tab Pain did not interfere with his tolerance to activity, however lethargy did interfere      Mobility  Bed Mobility Overal bed mobility: Needs Assistance Bed Mobility: Rolling;Sit to Sidelying Sit to sidelying: +2 for physical assistance;Mod assist General bed mobility comments: step by step cues for technique and back precautions/log rolling managed by therapist due to pt's sleepiness Transfers Overall transfer level:  Needs assistance Equipment used: Rolling walker (2 wheeled) Transfers: Sit to/from Stand Sit to Stand: +2 physical assistance;Mod assist General transfer comment: Pt. required 2 mod assist to rise to stand, min assist to control descent to sit on EOB.   Ambulation/Gait Ambulation/Gait assistance: Mod assist;+2 physical assistance Ambulation Distance (Feet): 3 Feet Assistive device: Rolling walker (2 wheeled) Gait Pattern/deviations: Step-to pattern General Gait Details: Pt. needed mod assist of 2 to direct him and to manage RW.  No buckling of LEs and was able to accept weight through LEs    Exercises     PT Diagnosis: Difficulty walking;Acute pain  PT Problem List: Decreased activity tolerance;Decreased balance;Decreased mobility;Decreased knowledge of use of DME;Decreased safety awareness;Decreased knowledge of precautions;Pain PT Treatment Interventions: DME instruction;Gait training;Stair training;Functional mobility training;Therapeutic activities;Balance training;Patient/family education     PT Goals(Current goals can be found in the care plan section) Acute Rehab PT Goals Patient Stated Goal: unable due to lethargy Time For Goal Achievement: 06/23/13 Potential to Achieve Goals: Good  Visit Information  Last PT Received On: 06/16/13 Assistance Needed: +2 History of Present Illness: Left-sided L4-5 transforaminal lumbar interbody fusion. 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  He is sleepy but can alert briefly to answer questions and opens eyes, then closes again when not being spoken to.      Extremity/Trunk Assessment Upper Extremity Assessment Upper Extremity Assessment: Overall WFL for tasks assessed Lower Extremity Assessment Lower Extremity Assessment: Overall WFL for tasks assessed   Balance Balance Overall balance assessment: Needs assistance Sitting-balance support: Bilateral upper extremity supported;Feet supported Sitting balance-Leahy Scale: Fair Standing balance support: Bilateral upper extremity supported;During functional activity Standing balance-Leahy Scale: Poor (due to lethargy)  End of Session PT - End of Session Equipment Utilized During Treatment: Gait belt Activity Tolerance: Patient limited by lethargy Patient left: in bed;with call bell/phone within reach;with family/visitor present Nurse Communication: Mobility status (persistent lethargy)  GP     Ladona Ridgel 06/16/2013, 4:11 PM Gerlean Ren PT Acute Rehab Services [place] [phone]

## 2013-06-16 NOTE — Plan of Care (Signed)
Problem: Consults Goal: Diagnosis - Spinal Surgery Outcome: Completed/Met Date Met:  06/16/13 Thoraco/Lumbar Spine Fusion.  Posterior left L4-5 TLIF

## 2013-06-16 NOTE — Progress Notes (Signed)
Patient doing well. Denies leg pain. + LBP. Foley has been removed.  BP 104/64  Pulse 81  Temp(Src) 97.6 F (36.4 C) (Oral)  Resp 16  SpO2 98% UOP 550  NVI Dressing in place  S/p 4/5 TLIF doing well  - d/c PCA - oxycontin/percocet/valium/robaxin - up with PT/OT -likely d/c home Friday/Saturday

## 2013-06-16 NOTE — Progress Notes (Addendum)
MD/PA notified of Stridor incident. Order received to continue to monitor patient and use IS lightly. CPAP as at home.  1500- patient resting since this am without further incident. Unable to void on own - bladder scan shows no volume.  Abdomen distended, faint BS, no flatus. MD/PA paged by office,  1600-PA on way to see patient.

## 2013-06-16 NOTE — Op Note (Signed)
NAMEMALAKHAI, BEITLER              ACCOUNT NO.:  [id number]  MEDICAL RECORD NO.:  [id number]  LOCATION:  [id number]                        FACILITY:  [facility]  PHYSICIAN:  Phylliss Bob, MD      DATE OF BIRTH:  [birth year]  DATE OF PROCEDURE:  06/15/2013                              OPERATIVE REPORT   PREOPERATIVE DIAGNOSES: 1. Grade 1 L4-5 spondylolisthesis. 2. 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 3. Severe left-sided L4 radiculopathy.  POSTOPERATIVE DIAGNOSES: 1. Grade 1 L4-5 spondylolisthesis. 2. 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 3. Severe left-sided L4 radiculopathy.  PROCEDURE: 1. Left-sided L4-5 transforaminal lumbar interbody fusion. 2. Right-sided L4-5 posterolateral fusion. 3. Placement of posterior instrumentation L4, L5 (7 mm screws). 4. Insertion of interbody device x1 (11 x 27 mm parallel [place]     bullet cage). 5. Use of morselized allograft (DBX mix). 6. Intraoperative use of fluoroscopy.  SURGEON:  Phylliss Bob, MD  ASSISTANT:  Pricilla Holm PA-C  ANESTHESIA:  General endotracheal anesthesia.  COMPLICATIONS:  None.  DISPOSITION:  Stable.  ESTIMATED BLOOD LOSS:  150 mL.  INDICATIONS FOR PROCEDURE:  Briefly, Paul Padilla is a very pleasant 58- year-old male, who did initially present to me on May 09, 2013, with severe pain in the left leg.  He has been having issues on and off for the last 7 years.  An MRI did reveal severe neural foraminal stenosis on the left side at the L4-5 level.  In addition, a spondylolisthesis was noted at L4-5.  The patient did fail multiple forms of conservative care and did continue to have symptoms.  Given his ongoing and debilitating pain, we did discuss proceeding with a left-sided L4-5 transforaminal lumbar interbody fusion as reflected above.  The patient did fully understand the risks and limitations of the procedure as outlined in my preoperative note.  OPERATIVE DETAILS:  On June 15, 2013,  the patient was brought to surgery and general endotracheal anesthesia was administered.  The patient was placed prone on a well-padded flat Jackson bed with a spinal frame.  All bony prominences were meticulously padded.  The ulnar nerves were adequately protected.  Of note, neurologic monitoring was utilized throughout the procedure and was set up and monitored with a [employer] officer. Also of note, there was no abnormal EMG activity encountered throughout the entirety of the procedure.  The back was then prepped and draped in the usual sterile fashion.  Antibiotics were given and a time-out procedure was performed.  I then made a midline incision overlying the L4-5 intervertebral space.  The paraspinal musculature was incised at the midline and the paraspinal musculature was bluntly swept laterally on the left and the right sides.  I then was able to identify the L4 and L5 lamina using a lateral intraoperative fluoroscopic view.  I then cannulated the L4 and L5 pedicles using anatomic landmarks.  On the right side, 7 mm screws of the appropriate length were placed into the L4 and L5 pedicles.  Of note, a ball-tipped probe was used to confirm that there was no cortical violation.  A 40 mm rod was placed and distraction was applied and caps were placed and provisionally tightened.  I then turned my attention  towards the left side.  Again, the L4 and L5 pedicles were cannulated.  I did use AP and lateral fluoroscopy well cannulating the pedicles.  The ball-tipped probe was used to confirm that there was no cortical violation and bone wax was placed in the place of the cannulated pedicles.  I then went forward with a full facetectomy.  I was able to identify the exiting L4 nerve in its entirety, and I was able to confirm that there was complete decompression of the exiting L4 nerve.  I then had an assistant hold the medial retraction of the traversing L5 nerve.  I then used a 15-blade knife to  perform an annulotomy at the posterolateral aspect of the annulus.  I then used a series of paddle scrapers and curettes and pituitary rongeurs to perform a thorough and complete diskectomy.  After ensuring appropriate preparation of the endplates, I did place a series of trials and  did feel that an 11 x 27 mm interbody spacer would be the most appropriate fit.  The intervertebral space was then packed liberally with DBX mix.  The appropriate size intervertebral spacer was then packed with DBX mix and tamped into position in the usual fashion. I was very pleased with the press fit of the implant.  The appropriate positioning of the implant was confirmed using AP and lateral fluoroscopy.  Distraction was then discontinued on the right side.  A 7 x 45 mm screws were placed on the left at the L4 and L5 pedicles.  A 48 mm rod was placed.  Caps were then placed and a final locking procedure was performed on both the right and the left sides.  Of note, the pedicle screws on the left were tested using triggered EMG, and there was no screw that tested below 20 milliamps.  I then explored the wound for any undue bleeding and none was encountered.  There was no extravasation of cerebrospinal fluid encountered throughout the surgery. I then obtained final AP and lateral fluoroscopic views, and was very pleased with the appearance of the radiographs.  Of note, the wound was copiously irrigated throughout the entirety of the procedure.  I then closed the fascia using #1 Vicryl.  The subcutaneous layer was closed using 2-0 Vicryl, and the skin was closed using 3-0 Monocryl.  Benzoin and Steri-Strips were applied, followed by a sterile dressing.  All instrument counts were correct at the termination of the procedure.  Pricilla Holm was my assistant throughout the surgery and did aid in retraction, suctioning, and closure.     Phylliss Bob, MD     MD/MEDQ  D:  06/15/2013  T:  06/16/2013  Job:   [id number]  cc:   Dalbert Batman, MD

## 2013-06-16 NOTE — Progress Notes (Signed)
Called to room and patient with audible stridor after using IS.. O2 sat 98-100% on 1L/M. c/o SOB. HOB elevated, Pain increased- given pain medicaton. [id number]. Some relief per patient with sips water. Lungs clear.

## 2013-06-16 NOTE — Evaluation (Signed)
Occupational Therapy Evaluation Patient Details Name: Paul Padilla MRN: [id number] DOB: [birth year] Today's Date: 06/16/2013 Time: 4132-4401 OT Time Calculation (min): 31 min  OT Assessment / Plan / Recommendation History of present illness Left-sided L4-5 transforaminal lumbar interbody fusion. Right-sided L4-5 posterolateral fusion   Clinical Impression   This 58 yo male admitted and underwent above presents to acute OT with decreased mobility, increased pain, increased lethargy (suspect due to pain meds), back precautions--all affecting pt's PLOF at an Independent level. Pt will benefit from acute OT without need for follow-up.    OT Assessment  Patient needs continued OT Services    Follow Up Recommendations  No OT follow up       Equipment Recommendations  3 in 1 bedside comode       Frequency  Min 3X/week    Precautions / Restrictions Precautions Precautions: Back Required Braces or Orthoses:  (None in chart and none in room) Restrictions Weight Bearing Restrictions: No   Pertinent Vitals/Pain 4/10 back; pre-medicated    ADL  Equipment Used: Gait belt;Rolling walker Transfers/Ambulation Related to ADLs: Mod A for all with only 5 steps due to lethargy and needing constant cues to move and stay awake ADL Comments: total A at this time due to lethargy    OT Diagnosis: Generalized weakness;Cognitive deficits;Acute pain  OT Problem List: Decreased strength;Decreased range of motion;Decreased activity tolerance;Impaired balance (sitting and/or standing);Pain;Decreased safety awareness;Decreased cognition;Decreased knowledge of use of DME or AE;Decreased knowledge of precautions OT Treatment Interventions: Self-care/ADL training;DME and/or AE instruction;Patient/family education;Balance training   OT Goals(Current goals can be found in the care plan section) Acute Rehab OT Goals Patient Stated Goal: unable due to lethargy OT Goal Formulation: With family Time For Goal  Achievement: 06/23/13 Potential to Achieve Goals: Good  Visit Information  Last OT Received On: 06/16/13 Assistance Needed: +2 History of Present Illness: Left-sided L4-5 transforaminal lumbar interbody fusion. Right-sided L4-5 posterolateral fusion       Prior [place] expects to be discharged to:: Private residence Living Arrangements: Spouse/significant other Available Help at Discharge: Family;Available 24 hours/day (initially) Type of Home: House Home Access: Stairs to enter [employer] of Steps: 4 Entrance Stairs-Rails: None Home Layout: One level Home Equipment: Hand held shower head Prior Function Level of Independence: Independent Communication Communication: No difficulties Dominant Hand: Right         Vision/Perception Vision - History Patient Visual Report: No change from baseline   Cognition  Cognition Arousal/Alertness: Lethargic;Suspect due to medications Behavior During Therapy: Flat affect Overall Cognitive Status: Impaired/Different from baseline (suspect due to meds)    Extremity/Trunk Assessment Upper Extremity Assessment Upper Extremity Assessment: Overall WFL for tasks assessed Lower Extremity Assessment Lower Extremity Assessment: Defer to PT evaluation     Mobility Bed Mobility Overal bed mobility: Needs Assistance Bed Mobility: Rolling;Sidelying to Sit Rolling: Mod assist Sidelying to sit: Min assist General bed mobility comments: Pt needed sequencing cues Transfers Overall transfer level: Needs assistance Equipment used: Rolling walker (2 wheeled) Transfers: Sit to/from [facility] Sit to Stand: Min assist Stand pivot transfers: Mod assist General transfer comment: Pt needed constant cues for correct hand positioning and sequecing cues        Balance Balance Overall balance assessment: Needs assistance Sitting-balance support: Feet supported;Bilateral upper  extremity supported Sitting balance-Leahy Scale: Poor (due to lethargy) Standing balance support: Bilateral upper extremity supported Standing balance-Leahy Scale: Poor (due to lethargy)   End of Session OT - End of  Session Equipment Utilized During Treatment: Gait belt;Rolling walker;Oxygen Activity Tolerance: Patient limited by lethargy Patient left: in chair;with family/visitor present (family sitting next to him due to lethargy)       Almon Register [phone] 06/16/2013, 10:52 AM

## 2013-06-16 NOTE — Progress Notes (Addendum)
Patient with improved BP, bladder scan remains low at 57 ml. No results from suppository. Office called to page PA on call.   1855- PA called back with bolus order. Monitor constipation.

## 2013-06-16 NOTE — Progress Notes (Signed)
RT placed CPAP on patient. Patient stated that he does not wear his CPAP at home. Patient stated that when he wore his that he wore a face mask. Settings are on auto titrate 6 min and 20 max.RT will continue to monitor. Informed patient to call for assistance if he has any problems with the machine.

## 2013-06-17 LAB — CBC
HEMATOCRIT: 37.3 % — AB (ref 39.0–52.0)
Hemoglobin: 12.2 g/dL — ABNORMAL LOW (ref 13.0–17.0)
MCH: 29.8 pg (ref 26.0–34.0)
MCHC: 32.7 g/dL (ref 30.0–36.0)
MCV: 91 fL (ref 78.0–100.0)
Platelets: 165 10*3/uL (ref 150–400)
RBC: 4.1 MIL/uL — ABNORMAL LOW (ref 4.22–5.81)
RDW: 15.3 % (ref 11.5–15.5)
WBC: 10.9 10*3/uL — AB (ref 4.0–10.5)

## 2013-06-17 LAB — BASIC METABOLIC PANEL
BUN: 22 mg/dL (ref 6–23)
CALCIUM: 8.3 mg/dL — AB (ref 8.4–10.5)
CHLORIDE: 97 meq/L (ref 96–112)
CO2: 26 meq/L (ref 19–32)
Creatinine, Ser: 2.09 mg/dL — ABNORMAL HIGH (ref 0.50–1.35)
GFR calc Af Amer: 39 mL/min — ABNORMAL LOW (ref >90)
GFR calc non Af Amer: 34 mL/min — ABNORMAL LOW (ref >90)
Glucose, Bld: 140 mg/dL — ABNORMAL HIGH (ref 70–99)
Potassium: 4 mEq/L (ref 3.7–5.3)
Sodium: 136 mEq/L — ABNORMAL LOW (ref 137–147)

## 2013-06-17 MED ORDER — SENNA 8.6 MG PO TABS
1.0000 | ORAL_TABLET | Freq: Two times a day (BID) | ORAL | Status: DC | PRN
  Filled 2013-06-17: qty 1

## 2013-06-17 MED ORDER — BISACODYL 10 MG RE SUPP: 10.0000 mg | Freq: Once | RECTAL | Status: DC

## 2013-06-17 MED ORDER — FLEET ENEMA 7-19 GM/118ML RE ENEM: 1.0000 | ENEMA | Freq: Every day | RECTAL | Status: DC | PRN

## 2013-06-17 MED ORDER — TAMSULOSIN HCL 0.4 MG PO CAPS
0.4000 mg | ORAL_CAPSULE | Freq: Every day | ORAL | Status: DC
  Administered 2013-06-17 – 2013-06-18 (×2): 0.4 mg via ORAL
  Filled 2013-06-17 (×2): qty 1

## 2013-06-17 NOTE — Progress Notes (Signed)
Physical Therapy Treatment Patient Details Name: Paul Padilla MRN: [id number] DOB: 10/15/1955 Today's Date: 06/17/2013 Time: 1000-1029 PT Time Calculation (min): 29 min  PT Assessment / Plan / Recommendation  History of Present Illness Left-sided L4-5 transforaminal lumbar interbody fusion. Right-sided L4-5 posterolateral fusion   PT Comments   Pt. With improved level of alertness though still on the "sleepy" side.  He was able to keep eyes open and was engaged most of session.  He is following directions well and is slowly progressing with his mobility.  I am unsure that he will be ready to DC home Saturday from a PT perspective.  Will follow over weekend.  Follow Up Recommendations  Home health PT;Other (comment) (at current rate of progress, he is likely to need HHPT)     Does the patient have the potential to tolerate intense rehabilitation     Barriers to Discharge        Equipment Recommendations  Rolling walker with 5" wheels (if not available to borrow)    Recommendations for Other Services    Frequency Min 6X/week   Progress towards PT Goals Progress towards PT goals: Progressing toward goals  Plan Current plan remains appropriate    Precautions / Restrictions Precautions Precautions: Back Precaution Comments: review of back precautions and log rollign technique Restrictions Weight Bearing Restrictions: No   Pertinent Vitals/Pain See vitals tab Pt. With pain somewhat limiting his mobility   Mobility  Bed Mobility Overal bed mobility: Needs Assistance Bed Mobility: Sit to Sidelying Rolling: Min assist Sit to sidelying: +2 for physical assistance;Mod assist General bed mobility comments: mod assist of 2 at shoulders and legs for sit to sidelying  Transfers Overall transfer level: Needs assistance Equipment used: Rolling walker (2 wheeled) Transfers: Sit to/from Stand Sit to Stand: Min assist;Mod assist General transfer comment: cues to keep spine erect and legs  flexing as he descends to surface such as 3n1 and bed, min assist for safety and stability Ambulation/Gait Ambulation/Gait assistance: Min assist Ambulation Distance (Feet): 20 Feet (10' to bathroom then 20' with seated rest) Assistive device: Rolling walker (2 wheeled) Gait Pattern/deviations: Step-to pattern Gait velocity: decreased General Gait Details: Pt. with increased use of UEs on RW, encouraged pt. to lighten up and rely on legs more.    Exercises     PT Diagnosis:    PT Problem List:   PT Treatment Interventions:     PT Goals (current goals can now be found in the care plan section)    Visit Information  Last PT Received On: 06/17/13 Assistance Needed: +2 (hopefully +1 after next visit) History of Present Illness: Left-sided L4-5 transforaminal lumbar interbody fusion. Right-sided L4-5 posterolateral fusion    Subjective Data  Subjective: Pt. says he feels more alert than yesterday but not back to normal.  Reports slight dizziness when up walking.  Voices still unable to urinate when he attempted during PT session (walked to BR)   Cognition  Cognition Arousal/Alertness: Awake/alert Behavior During Therapy: WFL for tasks assessed/performed Overall Cognitive Status: Within Functional Limits for tasks assessed General Comments: pt.more alert and engaged today during session but still occasionally closes eyes as if he is going to sleep    Balance     End of Session PT - End of Session Equipment Utilized During Treatment: Gait belt Activity Tolerance: Patient limited by fatigue Patient left: in bed;with call bell/phone within reach;with family/visitor present Nurse Communication: Mobility status   GP     Ladona Ridgel 06/17/2013, 4:22  PM Gerlean Ren PT Acute Rehab Services [phone] [place] [phone]

## 2013-06-17 NOTE — Progress Notes (Signed)
Patient unable to void adequate amount post foley removal at 7am.  Patient only voided scant amount (approx. 36ml) after multiple attempts tonight.  Bladder scan revealed 494ml.  Rectal exam performed.  Rectal tone intact and sensation to sharp edge of cotton tipped applicator present to perirectal area.  In and out cath performed at midnight and 639ml of clear, yellow urine obtained.  Patient educated to possible causes of urinary retention and interventions.  Continue to monitor for spontaneous void.    One attempt to ambulate to bathroom unsuccessful at 2030 due to pain, weakness, and dizziness.  (Had to return to bed after 2-3 steps).  BP 110/61 sitting.

## 2013-06-17 NOTE — Progress Notes (Signed)
PO day 2. Pt c/o over sedation. Has been able to void small amounts on own but feels very sleepy and scared to get out of bed secondary to LBP and sedation. Healthy appetite ate full breakfast this morning. Some mild constipation, small BM's noted. No leg pain, +LBP with movement. Has been out of bed very little.  BP 120/56  Pulse 78  Temp(Src) 98 F (36.7 C) (Oral)  Resp 18  SpO2 99% BP trending Up after 750ML bolus yesterday and adequate PO hydration   Pt lying in hospital bed, moderately sedated, appears very comfortable. Incision CDI. NVI. SCD's in place  PO Day 2 S/P L4-5 TLIF  -D/C oxycontin   -Oral percocet and valium only, stagger doses and monitor sedation   -PT/OT to work with pt today, discussed the importance of mobility   -Cont to monitor voiding and BM, suppository ordered  -CBC/CMET ordered to eval, likely fatigue sedation related and not anemia   -Likely D/C home sat post PT vs Sun pending progress

## 2013-06-17 NOTE — Progress Notes (Signed)
Patient sitting up in bed eating breakfast.  Slept well last night, drowsy this am.  Patient did not use C-pap as ordered but kept O2 at 2L with sats in high 90's-100%.   Still no spontaneous void since in and out cath at midnight.  Denies bladder pain or discomfort.  Will bladder scan and recheck after breakfast.    Discussed overnight condition with PA-C this am.  Report given to oncoming nurse and update given to physical therapist.  New orders received for flomax, senna, OxyCR and IV robaxin d/c'd, only use percocet and valium, and stat labs.

## 2013-06-17 NOTE — Progress Notes (Signed)
Patient refuses CPAP therapy. States that he does not wear at home, although he did have a machine at one time.  Hospital machine removed from room.

## 2013-06-17 NOTE — Progress Notes (Signed)
Occupational Therapy Treatment Patient Details Name: Paul Padilla MRN: [id number] DOB: Nov 02, 1955 Today's Date: 06/17/2013 Time: 7322-0254 OT Time Calculation (min): 43 min  OT Assessment / Plan / Recommendation  History of present illness Left-sided L4-5 transforaminal lumbar interbody fusion. Right-sided L4-5 posterolateral fusion   OT comments  Agreeable to oob.  Able to follow inst. For bed mobility tech. Moving slow but assisting with supine to sit.  Able to amb. To/from b.room. Closing eyes through out session. Expressed "i am trying its just these meds". Wife present and con't. To encourage pt. To keep eyes open and stay engaged.  Pt. Motivated and requested further amb. Once up and moving in the room.  Will benefit from con't. Therapy while here in prep for d/c home.   Follow Up Recommendations  No OT follow up           Equipment Recommendations  3 in 1 bedside comode        Frequency Min 3X/week   Progress towards OT Goals Progress towards OT goals: Progressing toward goals  Plan Discharge plan remains appropriate    Precautions / Restrictions Precautions Precautions: Back Precaution Comments: provided demo and review of all back precautions   Pertinent Vitals/Pain Denies pain    ADL  Eating/Feeding: Performed;Set up Grooming: Simulated;Minimal assistance Where Assessed - Grooming: Supported standing Lower Body Dressing: +1 Total assistance Where Assessed - Lower Body Dressing: Supported sitting Toilet Transfer: Performed;Moderate assistance;Minimal assistance Science writer: Comfort height toilet;Grab bars Toileting - Clothing Manipulation and Hygiene: Performed;Minimal assistance Where Assessed - Camera operator Manipulation and Hygiene: Standing Equipment Used: Rolling walker Transfers/Ambulation Related to ADLs: min/mod a for intial sit/stand, amb. slow with shuffled gait. states he is "trying" to take steps but feet con't. to shuffle not  lifting off of floor during steps ADL Comments: attempted LB dressing supported sitting. unable. wife present states she will assist and they both decline a/e needs.  amb. to toilet and request to attempt urinating in standing.  stood with lue support on grab bar, no lob noted but pt. unable to urinate.  amb. back to recliner in room.  provided demo of tub transfer tech. and reviwed use of 3-n-1.  pt. unable to lift legt to attempts side step transfer over tub today.  will attempt at next session.     OT Goals(current goals can now be found in the care plan section)    Visit Information  Last OT Received On: 06/17/13 History of Present Illness: Left-sided L4-5 transforaminal lumbar interbody fusion. Right-sided L4-5 posterolateral fusion                Cognition  Cognition Arousal/Alertness: Lethargic;Suspect due to medications Behavior During Therapy: Flat affect Area of Impairment: Attention General Comments: pt. con't. to close eyes during session. wife and myself with verbal cues to sustain attention and remain engaged.  states he feels it is from "all these meds"    Mobility  Bed Mobility Overal bed mobility: Needs Assistance Bed Mobility: Rolling;Sidelying to Sit Rolling: Mod assist Sidelying to sit: Mod assist General bed mobility comments: inst. cues for tech., pt. able to follow inst. but still required assistance to bring trunk upright. also cues to not hold breath during functional movements Transfers Overall transfer level: Needs assistance Equipment used: Rolling walker (2 wheeled) Transfers: Sit to/from [facility] Sit to Stand: Min assist;Mod assist Stand pivot transfers: Min assist;Mod assist General transfer comment: cues for hand placement and tech. for controlled descend to recliner as  pt. is very tall, increased difficulty transitioning to a lower surface              End of Session OT - End of Session Equipment Utilized During Treatment:  Rolling walker Activity Tolerance: Patient tolerated treatment well Patient left: in chair;with call bell/phone within reach;with family/visitor present       Janice Coffin, COTA/L 06/17/2013, 10:07 AM

## 2013-06-18 LAB — BASIC METABOLIC PANEL
BUN: 18 mg/dL (ref 6–23)
CHLORIDE: 105 meq/L (ref 96–112)
CO2: 27 mEq/L (ref 19–32)
Calcium: 8.6 mg/dL (ref 8.4–10.5)
Creatinine, Ser: 1.17 mg/dL (ref 0.50–1.35)
GFR calc non Af Amer: 68 mL/min — ABNORMAL LOW (ref >90)
GFR, EST AFRICAN AMERICAN: 79 mL/min — AB (ref >90)
GLUCOSE: 103 mg/dL — AB (ref 70–99)
Potassium: 3.7 mEq/L (ref 3.7–5.3)
SODIUM: 141 meq/L (ref 137–147)

## 2013-06-18 NOTE — Progress Notes (Signed)
PO day 3.  Eating well. +BM, lots of voiding.  No leg pain, +LBP with movement. PT feels patient ready for d/c home today.  BP 132/76  Pulse 97  Temp(Src) 99.1 F (37.3 C) (Oral)  Resp 22  Ht 6' (1.829 m)  Wt 108.319 kg (238 lb 12.8 oz)  BMI 32.38 kg/m2  SpO2 100% BP trending Up after 750ML bolus yesterday and adequate PO hydration   Pt lying in hospital bed, eating breakfast. Incision CDI. NVI. SCD's in place  PO Day 3 S/P L4-5 TLIF  -Oral percocet and valium only, stagger doses and monitor sedation   -PT/OT to work with pt today, discussed the importance of mobility   -Creatinine down, will saline lock IVFs  -Likely D/C home sat post PT

## 2013-06-18 NOTE — Progress Notes (Signed)
Occupational Therapy Treatment Patient Details Name: Paul Padilla MRN: [id number] DOB: [birth year] Today's Date: 06/18/2013 Time: 2376-2831 OT Time Calculation (min): 15 min  OT Assessment / Plan / Recommendation  History of present illness Left-sided L4-5 transforaminal lumbar interbody fusion. Right-sided L4-5 posterolateral fusion   OT comments  Great improvement from previous session.  Awake and alert today. Wife and pt. Pleased with his progress.  D/c home today.  Able to complete all aspects of toileting s and tub transfer min guard a. Pt. And wife have no other questions with self care.  Wife able to assist with lb needs.  Follow Up Recommendations  No OT follow up           Equipment Recommendations  3 in 1 bedside comode        Frequency Min 3X/week   Progress towards OT Goals Progress towards OT goals: Progressing toward goals  Plan Discharge plan remains appropriate    Precautions / Restrictions Precautions Precautions: Back Precaution Comments: review of back precautions and log rollign technique   Pertinent Vitals/Pain No c/o    ADL  Grooming: Performed;Supervision/safety Where Assessed - Grooming: Unsupported standing Toilet Transfer: Performed;Supervision/safety Toilet Transfer Method: Other (comment) (standing) Toilet Transfer Equipment: Comfort height toilet;Grab bars Toileting - Clothing Manipulation and Hygiene: Performed;Supervision/safety Where Assessed - Best boy and Hygiene: Standing Tub/Shower Transfer: Simulated;Min guard Tub/Shower Transfer Method: Ambulating Equipment Used: Rolling walker Transfers/Ambulation Related to ADLs: great improvement from yesterday.  able to perform ambulation with improved gait pattern and safety.  min guard for sit/stand cues for hand placement and not to "pull" on walker  ADL Comments: able to complete all aspects of toileting s in standing.  sim. tub transfer min guard. wife present for all  education and inst. on assisting with stablizing walker while pt. transitions in/out of tub.        OT Goals(current goals can now be found in the care plan section)    Visit Information  Last OT Received On: 06/18/13 Assistance Needed: +1 History of Present Illness: Left-sided L4-5 transforaminal lumbar interbody fusion. Right-sided L4-5 posterolateral fusion                 Cognition  Cognition Arousal/Alertness: Awake/alert Behavior During Therapy: WFL for tasks assessed/performed Overall Cognitive Status: Within Functional Limits for tasks assessed General Comments: awake and alert thorough out entire session.     Mobility  Bed Mobility Overal bed mobility: Needs Assistance Bed Mobility: Sit to Sidelying Rolling: Min assist General bed mobility comments: cues for initiation of log rolling tech. but able to complete components with s Transfers Overall transfer level: Needs assistance Equipment used: Rolling walker (2 wheeled) Transfers: Sit to/from [facility] Sit to Stand: Min assist;Min guard Stand pivot transfers: Min assist;Min guard General transfer comment: cues for hand placement as pt. has tendancy to want to pull on walker vs. push from surface          Balance General Comments General comments (skin integrity, edema, etc.): Pt's wife present for entire session.  At start of session pt asked PT to recline the back of his chair. When I pulled up on the lever the back of the chair abruptly went backwards about 1 inch. Pt c/o of increased pain in low back after this, but by end of session did not seem to think this caused greater pain.    End of Session OT - End of Session Equipment Utilized During Treatment: Rolling walker Activity Tolerance: Patient tolerated treatment  well Patient left: in bed;with call bell/phone within reach;with family/visitor present       Janice Coffin, COTA/L 06/18/2013, 2:54 PM

## 2013-06-18 NOTE — Discharge Instructions (Signed)
See printed info.

## 2013-06-18 NOTE — Progress Notes (Signed)
Physical Therapy Treatment Patient Details Name: Paul Padilla MRN: [id number] DOB: 03-Dec-1955 Today's Date: 06/18/2013 Time: 2353-6144 PT Time Calculation (min): 48 min  PT Assessment / Plan / Recommendation  History of Present Illness Left-sided L4-5 transforaminal lumbar interbody fusion. Right-sided L4-5 posterolateral fusion   PT Comments   Pt mobilizing much better today. Practiced stairs.  Pt reports increased pain today which he thinks is due to changing pain meds.  Wife present throughout session. Pt could likely DC home today with RW.    Follow Up Recommendations  Home health PT     Does the patient have the potential to tolerate intense rehabilitation     Barriers to Discharge        Equipment Recommendations  Rolling walker with 5" wheels    Recommendations for Other Services    Frequency Min 6X/week   Progress towards PT Goals Progress towards PT goals: Progressing toward goals  Plan Current plan remains appropriate    Precautions / Restrictions Precautions Precautions: Back Precaution Comments: review of back precautions and log rollign technique Pt unable to recall any of his precautions.   Pertinent Vitals/Pain C/o of increased back pain from yesterday.  6/10.     Mobility  Bed Mobility Overal bed mobility: Needs Assistance Bed Mobility: Sit to Sidelying Rolling: Min assist General bed mobility comments: min A to help get legs in bed Transfers Overall transfer level: Needs assistance Equipment used: Rolling walker (2 wheeled) Transfers: Sit to/from Stand Sit to Stand: Min assist General transfer comment: pt slow with transfers, but steady Ambulation/Gait Ambulation/Gait assistance: Min guard Ambulation Distance (Feet): 100 Feet Assistive device: Rolling walker (2 wheeled) Gait Pattern/deviations: Step-through pattern Gait velocity: decreased Stairs: Yes Stairs assistance: Min assist Stair Management: With cane (cane in left hand and held to PT  with Right) Number of Stairs: 2 General stair comments: Demonstrated 2 ways to manage stairs. Pt preferred forward using cane and holding to helper.     Exercises     PT Diagnosis:    PT Problem List:   PT Treatment Interventions:     PT Goals (current goals can now be found in the care plan section)    Visit Information  Last PT Received On: 06/18/13 Assistance Needed: +1 History of Present Illness: Left-sided L4-5 transforaminal lumbar interbody fusion. Right-sided L4-5 posterolateral fusion    Subjective Data  Subjective: pt walking from [place] with wife upon PT arrival   Cognition  Cognition Arousal/Alertness: Awake/alert Behavior During Therapy: WFL for tasks assessed/performed Overall Cognitive Status: Within Functional Limits for tasks assessed    Balance  General Comments General comments (skin integrity, edema, etc.): Pt's wife present for entire session.  At start of session pt asked PT to recline the back of his chair. When I pulled up on the lever the back of the chair abruptly went backwards about 1 inch. Pt c/o of increased pain in low back after this, but by end of session did not seem to think this caused greater pain.    End of Session PT - End of Session Equipment Utilized During Treatment: Gait belt Activity Tolerance: Patient tolerated treatment well Patient left: in bed;with call bell/phone within reach;with family/visitor present Nurse Communication: Mobility status   GP     Melvern Banker 06/18/2013, 12:24 PM Lavonia Dana, [place] 06/18/2013

## 2013-06-19 NOTE — Progress Notes (Signed)
   CARE MANAGEMENT NOTE 06/19/2013  Patient:  ILAI, HILLER   Account Number:  [id number]  Date Initiated:  06/18/2013  Documentation initiated by:  [facility]  Subjective/Objective Assessment:   adm: ongoing pain in the left leg. MRI = slippage and NF stenosis at L4/5     Action/Plan:   discharge planning   Anticipated DC Date:  06/18/2013   Anticipated DC Plan:  [place]  CM consult      Choice offered to / List presented to:     DME arranged  3-N-1  Vassie Moselle      DME agency  [place] arranged  [place] - 11 Patient Refused      Status of service:  Completed, signed off Medicare Important Message given?   (If response is "NO", the following Medicare IM given date fields will be blank) Date Medicare IM given:   Date Additional Medicare IM given:    Discharge Disposition:  HOME/SELF CARE  Per UR Regulation:    If discussed at Long Length of Stay Meetings, dates discussed:    Comments:  06/18/13 09:00 (late entry) CM spoke with pt and wife in room at length for choice.  Pt politely refuses HHPT but requests RW and 3n1.  DME to be delivered to room prior to discharge.  No other CM needs were communicated.  Mariane Masters, BSN, CM [phone].

## 2013-06-23 NOTE — Discharge Summary (Signed)
Patient ID: Paul Padilla MRN: [id number] DOB/AGE: 56-13-1957 56 y.o.  Admit date: 06/15/2013 Discharge date: 06/18/2013  Admission Diagnoses:  Active Problems:   Radiculopathy   Discharge Diagnoses:  Same  Past Medical History  Diagnosis Date  . Hypertension   . Dysrhythmia     irregular heartrate  . GERD (gastroesophageal reflux disease)   . Prostate cancer   . Sleep apnea     used to use CPAP, does not use at present time (06/15/2013)  . Arthritis     "joints" (06/15/2013)  . Chronic lower back pain     Surgeries: Procedure(s): POSTERIOR LUMBAR FUSION 1 LEVEL L4-5 on 06/15/2013   Discharged Condition: Improved  Hospital Course: Paul Padilla is an 58 y.o. male who was admitted 06/15/2013 for operative treatment of radiculopathy. Patient has severe unremitting pain that affects sleep, daily activities, and work/hobbies. After pre-op clearance the patient was taken to the operating room on 06/15/2013 and underwent  Procedure(s): POSTERIOR LUMBAR FUSION 1 LEVEL L4-5.    Patient was given perioperative antibiotics:  Anti-infectives   Start     Dose/Rate Route Frequency Ordered Stop   06/15/13 2000  ceFAZolin (ANCEF) IVPB 1 g/50 mL premix     1 g 100 mL/hr over 30 Minutes Intravenous Every 8 hours 06/15/13 1625 06/16/13 0444   06/15/13 0600  ceFAZolin (ANCEF) IVPB 2 g/50 mL premix     2 g 100 mL/hr over 30 Minutes Intravenous On call to O.R. 06/14/13 1427 06/15/13 1232       Patient was given sequential compression devices, early ambulation, to prevent DVT.  Patient benefited maximally from hospital stay and there were no complications.    Recent vital signs: BP 154/83  Pulse 93  Temp(Src) 98.5 F (36.9 C) (Oral)  Resp 18  Ht 6' (1.829 m)  Wt 108.319 kg (238 lb 12.8 oz)  BMI 32.38 kg/m2  SpO2 99%   Discharge Medications:     Medication List    STOP taking these medications       celecoxib 200 MG capsule  Commonly known as:  CELEBREX      TAKE these  medications       ALKA-SELTZER PLUS COLD PO  Take by mouth as needed (took one time on 06/05/13).     gabapentin 300 MG capsule  Commonly known as:  NEURONTIN  Take 300 mg by mouth 2 (two) times daily. Back pain     NYQUIL COLD & FLU PO  Take by mouth as needed (took one dose on 06/05/13).     TRIBENZOR 20-5-12.5 MG Tabs  Generic drug:  Olmesartan-Amlodipine-HCTZ  Take 1 tablet by mouth daily.        Diagnostic Studies: Dg Chest 2 View  06/09/2013   CLINICAL DATA:  Pre op  EXAM: CHEST  2 VIEW  COMPARISON:  None.  FINDINGS: The heart size and mediastinal contours are within normal limits. Both lungs are clear. The visualized skeletal structures are unremarkable.  IMPRESSION: No active cardiopulmonary disease.   Electronically Signed   By: Margaree Mackintosh M.D.   On: 06/09/2013 15:35   Dg Lumbar Spine 2-3 Views  06/15/2013   CLINICAL DATA Posterior fusion L4-5  EXAM LUMBAR SPINE - 2-3 VIEW  COMPARISON Intraoperative imaging 06/15/2013  FINDINGS Two intraoperative spot images demonstrate changes of posterior fusion at L4-5. No hardware or bony complicating feature noted.  IMPRESSION Posterior fusion at L4-5.  SIGNATURE  Electronically Signed   By: Rolm Baptise M.D.   On:  06/15/2013 13:21   Dg Lumbar Spine 1 View  06/15/2013   CLINICAL DATA Posterior fusion L4-5.  EXAM LUMBAR SPINE - 1 VIEW  COMPARISON MR LUMBAR SPINE W/O CM dated 04/18/2013  FINDINGS Single cross-table lateral view of the lumbar spine demonstrates posterior needles directed at the L4 and L5 spinous processes.  IMPRESSION Intraoperative localization as above.  SIGNATURE  Electronically Signed   By: Rolm Baptise M.D.   On: 06/15/2013 13:15    Disposition: 01-Home or Self Care        Follow-up Information   Follow up with Sinclair Ship, MD.   Specialty:  Orthopedic Surgery   Contact information:   [address] [place] [address] [phone]      PO Day 3 S/P L4-5 TLIF  -Oral percocet and  valium only, stagger doses and monitor sedation  -PT/OT to work with pt today, discussed the importance of mobility  -Creatinine down, will saline lock IVFs  -D/C home  Signed: Justice Britain 06/23/2013, 4:07 PM

## 2019-02-14 ENCOUNTER — Other Ambulatory Visit: Payer: Self-pay | Admitting: Orthopedic Surgery

## 2019-02-14 DIAGNOSIS — M542 Cervicalgia: Secondary | ICD-10-CM

## 2019-03-06 ENCOUNTER — Other Ambulatory Visit: Payer: Self-pay

## 2019-03-06 ENCOUNTER — Ambulatory Visit
Admission: RE | Admit: 2019-03-06 | Discharge: 2019-03-06 | Disposition: A | Discharge status: Home / Self Care | Payer: Federal, State, Local not specified - PPO | Source: Ambulatory Visit | Attending: Orthopedic Surgery | Admitting: Orthopedic Surgery

## 2019-03-06 DIAGNOSIS — M542 Cervicalgia: Secondary | ICD-10-CM

## 2020-07-13 IMAGING — MR MR CERVICAL SPINE W/O CM
4 of 5 series · 25 of 48 positions shown · non-contrast
Comparison: Cervical spine radiographs 01/31/2019.

CLINICAL DATA: 63-year-old male with cervical neck pain. Pain
radiating to the right elbow for several months with no known
injury. Query neurologic compression. History of prostate cancer in
9491.

EXAM:
MRI CERVICAL SPINE WITHOUT CONTRAST
TECHNIQUE: Multiplanar, multisequence MR imaging of the cervical spine was
performed. No intravenous contrast was administered.

[Series 3: T2 post-contrast · sagittal · 3.3mm · 0.37mm/px · 5 of 13 slices shown]
[im 1/13]
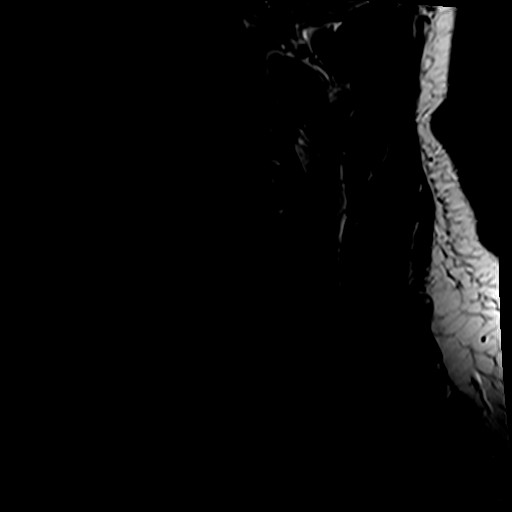
[im 4/13]
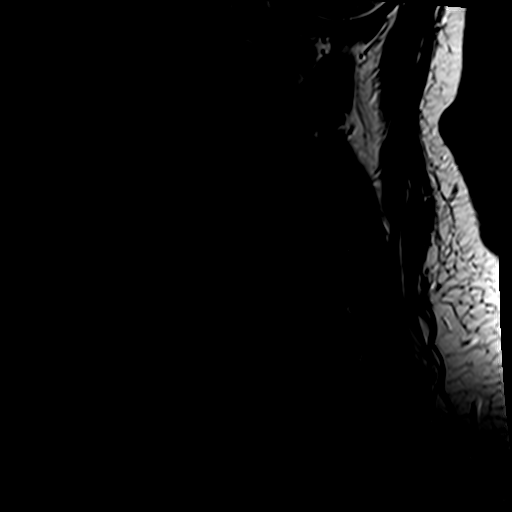
[im 7/13]
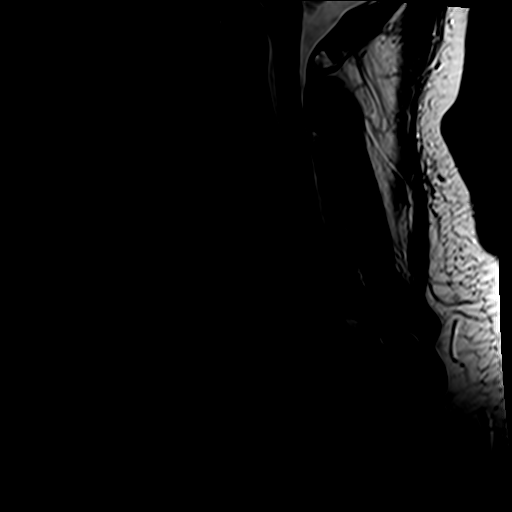
[im 10/13]
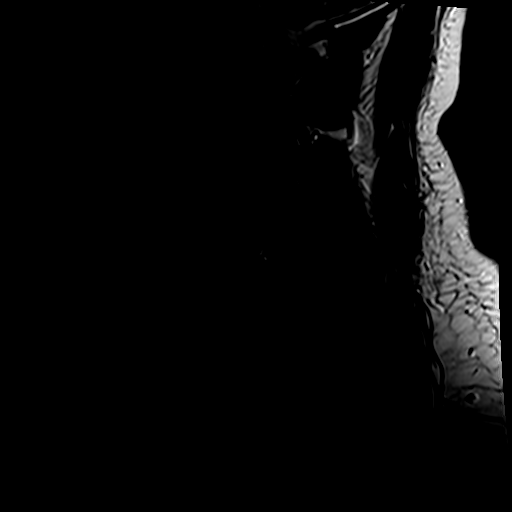
[im 13/13]
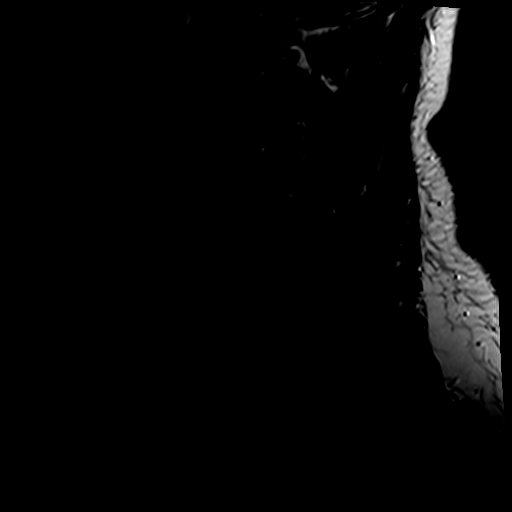

[Series 4: T1 · sagittal · 3.3mm · 0.37mm/px · 5 of 13 slices shown]
[im 1/13]
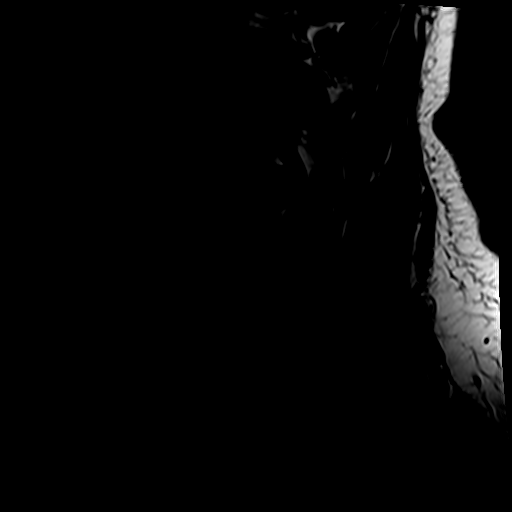
[im 4/13]
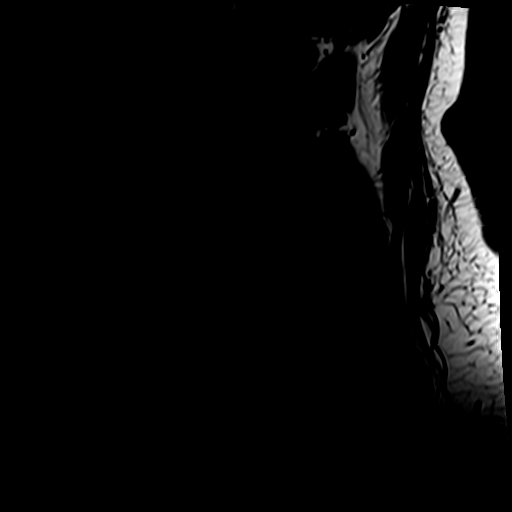
[im 7/13]
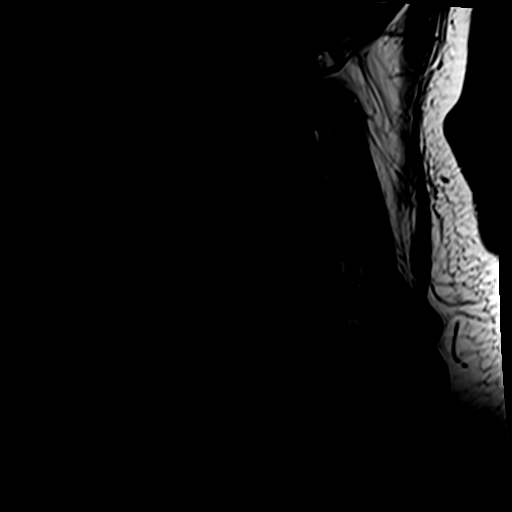
[im 10/13]
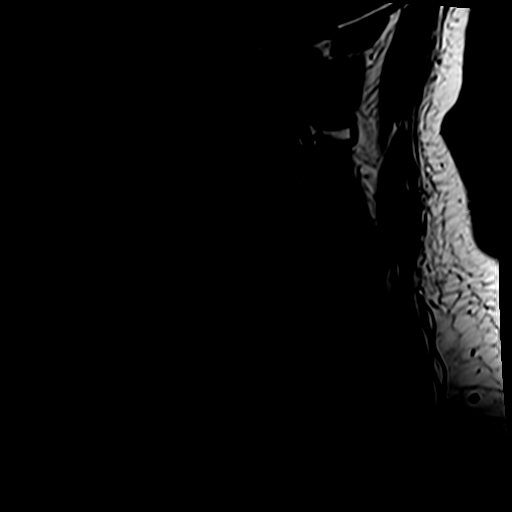
[im 13/13]
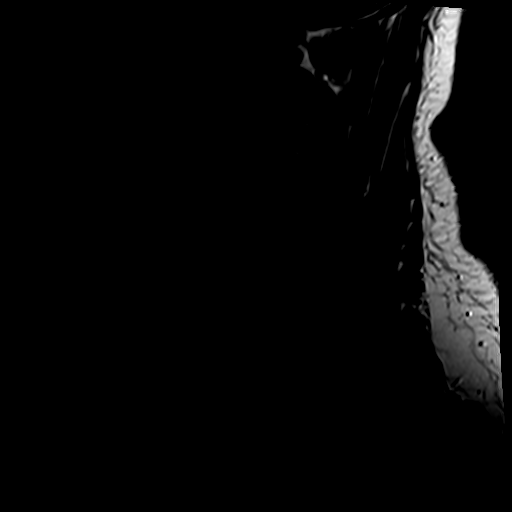

[Series 5: tir sag · sagittal · 3.3mm · 0.37mm/px · 5 of 13 slices shown]
[im 1/13]
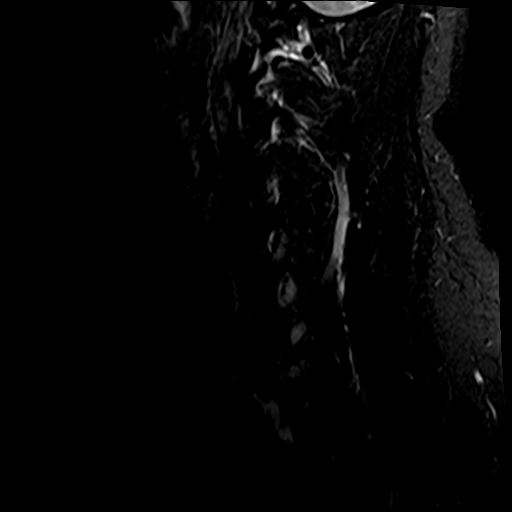
[im 3/13]
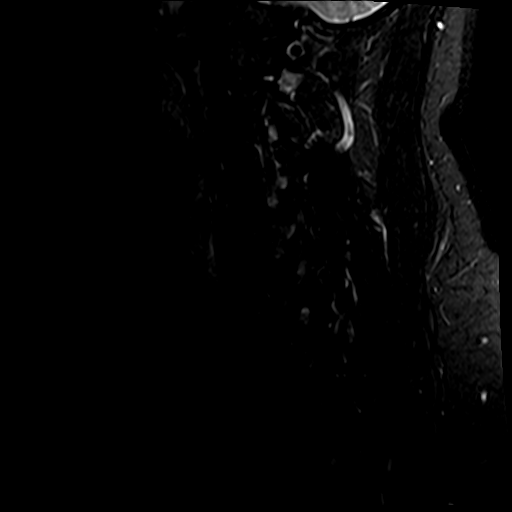
[im 5/13]
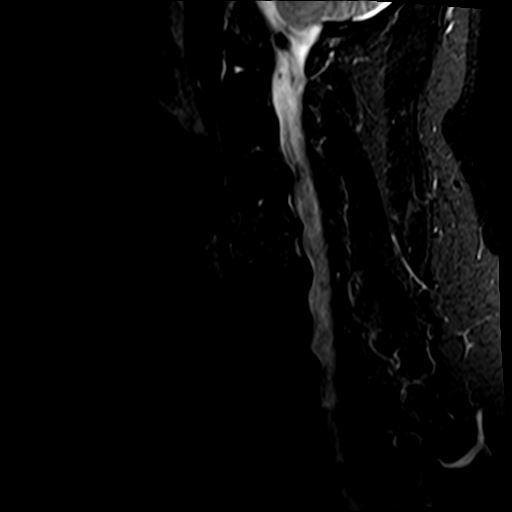
[im 8/13]
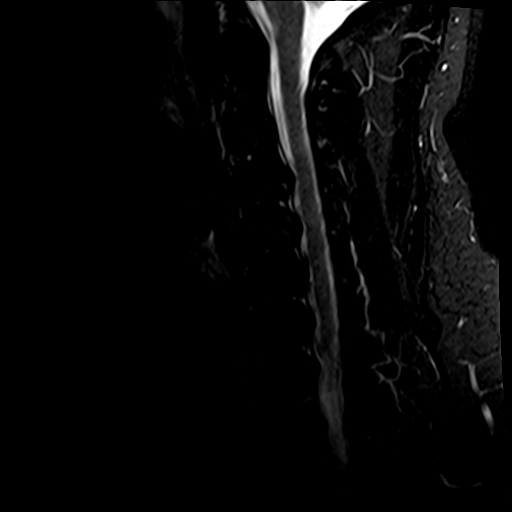
[im 13/13]
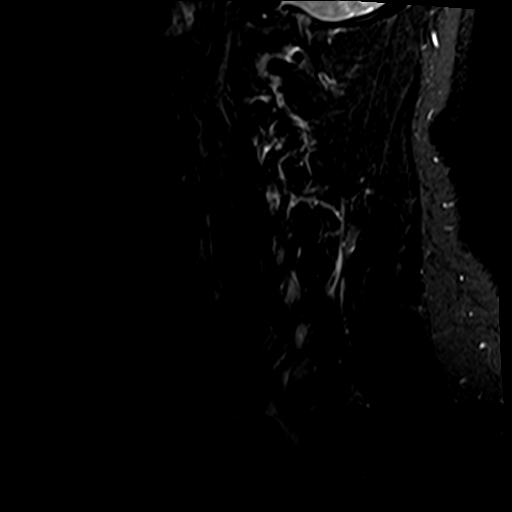

[Series 7: T2 · axial · 3.0mm · 0.70mm/px · z∈[-85,+41]mm · 10 of 36 slices shown]
[im 3/36]
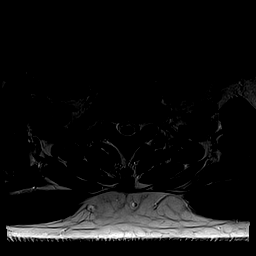
[im 5/36]
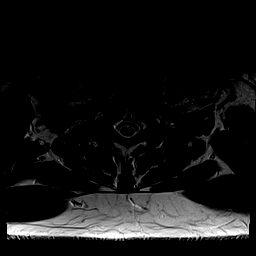
[im 8/36]
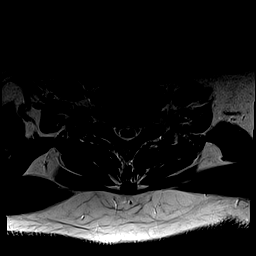
[im 12/36]
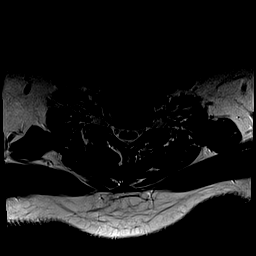
[im 17/36]
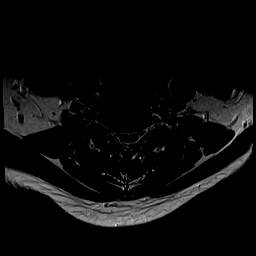
[im 19/36]
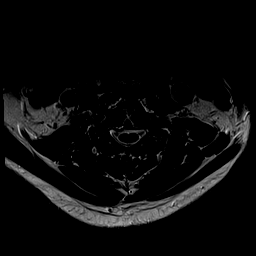
[im 22/36]
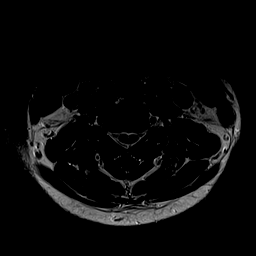
[im 26/36]
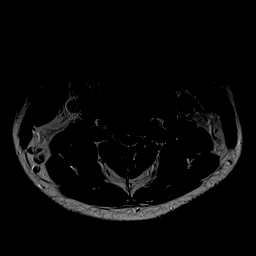
[im 31/36]
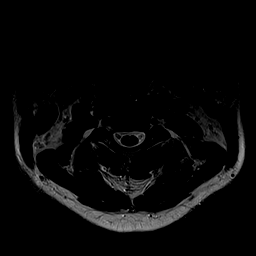
[im 36/36]
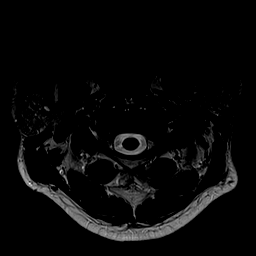

[25 of 48 positions shown; findings below may reference images not displayed]

FINDINGS: Alignment: Stable straightening of cervical lordosis. No
spondylolisthesis.

Vertebrae: Degenerative endplate marrow changes. No marrow edema or
evidence of acute osseous abnormality. Background bone marrow signal
is within normal limits.

Cord: Spinal cord signal is within normal limits at all visualized
levels.

Posterior Fossa, vertebral arteries, paraspinal tissues:
Cervicomedullary junction is within normal limits. Negative visible
posterior fossa. Preserved major vascular flow voids in the neck.
Negative neck soft tissues and visible lung apices.

Disc levels:

C2-C3: Mild foraminal disc osteophyte complex. Moderate C3 foraminal
stenosis greater on the left.

C3-C4: Disc space loss with circumferential disc bulge and endplate
spurring. Broad-based posterior component. Mild spinal stenosis.
Mild if any cord mass effect. Severe left and mild right C4
foraminal stenosis.

C4-C5: Disc space loss with circumferential disc osteophyte complex
eccentric to the right. Broad-based posterior component. Mild spinal
stenosis, no cord mass effect. Moderate to severe right and moderate
left C5 foraminal stenosis.

C5-C6: Disc space loss with right eccentric circumferential disc
osteophyte complex. Broad-based posterior component. Borderline to
mild spinal stenosis. Mild left and moderate to severe right C6
foraminal stenosis.

C6-C7: Disc space loss with circumferential disc bulge and mild
endplate spurring. Small left paracentral annular fissure of
otherwise broad-based posterior component of disc (series 7, image
25). Borderline to mild spinal stenosis, no cord mass effect.
Borderline to mild bilateral C7 foraminal stenosis.

C7-T1: Disc space loss with circumferential disc osteophyte complex.
Mildly lobulated broad-based posterior component. Bilateral
foraminal involvement with superimposed mild posterior element
hypertrophy. No significant spinal stenosis but moderate to severe
bilateral C8 foraminal stenosis.

T1-T2 disc and posterior element degeneration with borderline to
mild foraminal stenosis.
IMPRESSION: 1. Widespread cervical spine disc and endplate degeneration. No
acute osseous abnormality.
2. Multifactorial mild spinal stenosis from C3-C4 to C6-C7. Up to
mild cord mass effect at 1 or 2 levels but no cord signal
abnormality.
3. Associated moderate or severe neural foraminal stenosis at the
left C4, right C5, right C6, and bilateral C8 nerve levels.

## 2023-03-18 ENCOUNTER — Other Ambulatory Visit: Payer: Self-pay | Admitting: Orthopedic Surgery

## 2023-03-19 NOTE — Patient Instructions (Signed)
SURGICAL WAITING ROOM VISITATION Patients having surgery or a procedure may have no more than 2 support people in the waiting area - these visitors may rotate in the visitor waiting room.   Due to an increase in RSV and influenza rates and associated hospitalizations, children ages 28 and under may not visit patients in [facility] hospitals. If the patient needs to stay at the hospital during part of their recovery, the visitor guidelines for inpatient rooms apply.  PRE-OP VISITATION  Pre-op nurse will coordinate an appropriate time for 1 support person to accompany the patient in pre-op.  This support person may not rotate.  This visitor will be contacted when the time is appropriate for the visitor to come back in the pre-op area.  Please refer to the [facility] website for the visitor guidelines for Inpatients (after your surgery is over and you are in a regular room).  You are not required to quarantine at this time prior to your surgery. However, you must do this: Hand Hygiene often Do NOT share personal items Notify your provider if you are in close contact with someone who has COVID or you develop fever 100.4 or greater, new onset of sneezing, cough, sore throat, shortness of breath or body aches.  If you test positive for Covid or have been in contact with anyone that has tested positive in the last 10 days please notify you surgeon.    Your procedure is scheduled on:  Monday  March 30, 2023  Report to [facility] Main Entrance: [place] entrance where the [employer] is available.   Report to admitting at:  07:45   AM  Call this number if you have any questions or problems the morning of surgery [phone]  Do not eat food after Midnight the night prior to your surgery/procedure.  After Midnight you may have the following liquids until  07:15 AM DAY OF SURGERY  Clear Liquid Diet Water Black Coffee (sugar ok, NO MILK/CREAM OR CREAMERS)  Tea (sugar ok, NO  MILK/CREAM OR CREAMERS) regular and decaf                             Plain Jell-O  with no fruit (NO RED)                                           Fruit ices (not with fruit pulp, NO RED)                                     Popsicles (NO RED)                                                                  Juice: NO CITRUS JUICES: only apple, WHITE grape, WHITE cranberry Sports drinks like Gatorade or Powerade (NO RED)                   The day of surgery:  Drink ONE (1) Pre-Surgery G2 at  07:15 AM the morning of surgery. Drink in  one sitting. Do not sip.  This drink was given to you during your hospital pre-op appointment visit. Nothing else to drink after completing the Pre-Surgery G2 : No candy, chewing gum or throat lozenges.    FOLLOW ANY ADDITIONAL PRE OP INSTRUCTIONS YOU RECEIVED FROM YOUR SURGEON'S OFFICE!!!   Oral Hygiene is also important to reduce your risk of infection.        Remember - BRUSH YOUR TEETH THE MORNING OF SURGERY WITH YOUR REGULAR TOOTHPASTE  Do NOT smoke after Midnight the night before surgery.  STOP TAKING all Vitamins, Herbs and supplements 1 week before your surgery.   Take ONLY these medicines the morning of surgery with A SIP OF WATER: amlodipine                    You may not have any metal on your body including jewelry, and body piercing  Do not wear lotions, powders, cologne, or deodorant  Men may shave face and neck.  Contacts, Hearing Aids, dentures or bridgework may not be worn into surgery. DENTURES WILL BE REMOVED PRIOR TO SURGERY PLEASE DO NOT APPLY "Poly grip" OR ADHESIVES!!!  Patients discharged on the day of surgery will not be allowed to drive home.  Someone NEEDS to stay with you for the first 24 hours after anesthesia.  Do not bring your home medications to the hospital. The Pharmacy will dispense medications listed on your medication list to you during your admission in the Hospital.  Please read over the following fact sheets  you were given: IF YOU HAVE QUESTIONS ABOUT YOUR PRE-OP INSTRUCTIONS, PLEASE CALL [phone].     Pre-operative 5 CHG Bath Instructions   You can play a key role in reducing the risk of infection after surgery. Your skin needs to be as free of germs as possible. You can reduce the number of germs on your skin by washing with CHG (chlorhexidine gluconate) soap before surgery. CHG is an antiseptic soap that kills germs and continues to kill germs even after washing.   DO NOT use if you have an allergy to chlorhexidine/CHG or antibacterial soaps. If your skin becomes reddened or irritated, stop using the CHG and notify one of our RNs at [phone]  Please shower with the CHG soap starting 4 days before surgery using the following schedule: START SHOWERS ON Thursday  March 26, 2023                                                                                                                                                                              Please keep in mind the following:  DO NOT shave, including legs and underarms, starting the day of your  first shower.   You may shave your face at any point before/day of surgery.   Place clean sheets on your bed the day you start using CHG soap. Use a clean washcloth (not used since being washed) for each shower. DO NOT sleep with pets once you start using the CHG.   CHG Shower Instructions:  If you choose to wash your hair and private area, wash first with your normal shampoo/soap.  After you use shampoo/soap, rinse your hair and body thoroughly to remove shampoo/soap residue.  Turn the water OFF and apply about 3 tablespoons (45 ml) of CHG soap to a CLEAN washcloth.  Apply CHG soap ONLY FROM YOUR NECK DOWN TO YOUR TOES (washing for 3-5 minutes)  DO NOT use CHG soap on face, private areas, open wounds, or sores.  Pay special attention to the area where your surgery is being performed.  If you are having back surgery, having someone  wash your back for you may be helpful.  Wait 2 minutes after CHG soap is applied, then you may rinse off the CHG soap.  Pat dry with a clean towel  Put on clean clothes/pajamas   If you choose to wear lotion, please use ONLY the CHG-compatible lotions on the back of this paper.     Additional instructions for the day of surgery: DO NOT APPLY any lotions, deodorants, cologne, or perfumes.   Put on clean/comfortable clothes.  Brush your teeth.  Ask your nurse before applying any prescription medications to the skin.      CHG Compatible Lotions   Aveeno Moisturizing lotion  Cetaphil Moisturizing Cream  Cetaphil Moisturizing Lotion  Clairol Herbal Essence Moisturizing Lotion, Dry Skin  Clairol Herbal Essence Moisturizing Lotion, Extra Dry Skin  Clairol Herbal Essence Moisturizing Lotion, Normal Skin  Curel Age Defying Therapeutic Moisturizing Lotion with Alpha Hydroxy  Curel Extreme Care Body Lotion  Curel Soothing Hands Moisturizing Hand Lotion  Curel Therapeutic Moisturizing Cream, Fragrance-Free  Curel Therapeutic Moisturizing Lotion, Fragrance-Free  Curel Therapeutic Moisturizing Lotion, Original Formula  Eucerin Daily Replenishing Lotion  Eucerin Dry Skin Therapy Plus Alpha Hydroxy Crme  Eucerin Dry Skin Therapy Plus Alpha Hydroxy Lotion  Eucerin Original Crme  Eucerin Original Lotion  Eucerin Plus Crme Eucerin Plus Lotion  Eucerin TriLipid Replenishing Lotion  Keri Anti-Bacterial Hand Lotion  Keri Deep Conditioning Original Lotion Dry Skin Formula Softly Scented  Keri Deep Conditioning Original Lotion, Fragrance Free Sensitive Skin Formula  Keri Lotion Fast Absorbing Fragrance Free Sensitive Skin Formula  Keri Lotion Fast Absorbing Softly Scented Dry Skin Formula  Keri Original Lotion  Keri Skin Renewal Lotion Keri Silky Smooth Lotion  Keri Silky Smooth Sensitive Skin Lotion  Nivea Body Creamy Conditioning Oil  Nivea Body Extra Enriched Lotion  Nivea Body Original  Lotion  Nivea Body Sheer Moisturizing Lotion Nivea Crme  Nivea Skin Firming Lotion  NutraDerm 30 Skin Lotion  NutraDerm Skin Lotion  NutraDerm Therapeutic Skin Cream  NutraDerm Therapeutic Skin Lotion  ProShield Protective Hand Cream  Provon moisturizing lotion   FAILURE TO FOLLOW THESE INSTRUCTIONS MAY RESULT IN THE CANCELLATION OF YOUR SURGERY  PATIENT SIGNATURE_________________________________  NURSE SIGNATURE__________________________________  ________________________________________________________________________     Paul Padilla    An incentive spirometer is a tool that can help keep your lungs clear and active. This tool measures how well you are filling your lungs with each breath. Taking long deep breaths may help reverse or decrease the chance of developing breathing (pulmonary) problems (especially infection) following: A long period  of time when you are unable to move or be active. BEFORE THE PROCEDURE  If the spirometer includes an indicator to show your best effort, your nurse or respiratory therapist will set it to a desired goal. If possible, sit up straight or lean slightly forward. Try not to slouch. Hold the incentive spirometer in an upright position. INSTRUCTIONS FOR USE  Sit on the edge of your bed if possible, or sit up as far as you can in bed or on a chair. Hold the incentive spirometer in an upright position. Breathe out normally. Place the mouthpiece in your mouth and seal your lips tightly around it. Breathe in slowly and as deeply as possible, raising the piston or the ball toward the top of the column. Hold your breath for 3-5 seconds or for as long as possible. Allow the piston or ball to fall to the bottom of the column. Remove the mouthpiece from your mouth and breathe out normally. Rest for a few seconds and repeat Steps 1 through 7 at least 10 times every 1-2 hours when you are awake. Take your time and take a few normal breaths  between deep breaths. The spirometer may include an indicator to show your best effort. Use the indicator as a goal to work toward during each repetition. After each set of 10 deep breaths, practice coughing to be sure your lungs are clear. If you have an incision (the cut made at the time of surgery), support your incision when coughing by placing a pillow or rolled up towels firmly against it. Once you are able to get out of bed, walk around indoors and cough well. You may stop using the incentive spirometer when instructed by your caregiver.  RISKS AND COMPLICATIONS Take your time so you do not get dizzy or light-headed. If you are in pain, you may need to take or ask for pain medication before doing incentive spirometry. It is harder to take a deep breath if you are having pain. AFTER USE Rest and breathe slowly and easily. It can be helpful to keep track of a log of your progress. Your caregiver can provide you with a simple table to help with this. If you are using the spirometer at home, follow these instructions: SEEK MEDICAL CARE IF:  You are having difficultly using the spirometer. You have trouble using the spirometer as often as instructed. Your pain medication is not giving enough relief while using the spirometer. You develop fever of 100.5 F (38.1 C) or higher.                                                                                                    SEEK IMMEDIATE MEDICAL CARE IF:  You cough up bloody sputum that had not been present before. You develop fever of 102 F (38.9 C) or greater. You develop worsening pain at or near the incision site. MAKE SURE YOU:  Understand these instructions. Will watch your condition. Will get help right away if you are not doing well or get worse. Document Released: 08/04/2006 Document Revised: 06/16/2011 Document Reviewed:  10/05/2006 ExitCare Patient Information 2014 [place], [place].

## 2023-03-19 NOTE — Progress Notes (Addendum)
COVID Vaccine received:  []  No [x]  Yes Date of any COVID positive Test in last 95 days:None  PCP - Gardiner Fanti, PA-C,  Rocky Link, NP  clearance in [facility] 03-09-2023 note  [phone] (Work) [phone] (Fax)  Cardiologist - none  Urology- Hadley Pen, MD  at [facility]  Chest x-ray - 2015  2v  Epic EKG -06-19-2013 Epic   requested fax of 03-10-23 EKG done at PCP [facility] -  Cardiac Cath -   PCR screen: [x]  Ordered & Completed []   No Order but Needs PROFEND     []   N/A for this surgery  Surgery Plan:  [x]  Ambulatory   []  Outpatient in bed  []  Admit Anesthesia:    []  General  [x]  Spinal  []   Choice []   MAC  Pacemaker / ICD device [x]  No []  Yes   Spinal Cord Stimulator:[x]  No []  Yes       History of Sleep Apnea? []  No [x]  Yes   CPAP used?- [x]  No []  Yes    Does the patient monitor blood sugar?   []  N/A   [x]  No []  Yes  Patient has: []  NO Hx DM   [x]  Pre-DM   []  DM1  []   DM2 Last A1c was:  6.7  on    02-19-23   Blood Thinner / Instructions: none Aspirin Instructions:  ASA 81 mg  -already held   ERAS Protocol Ordered: []  No  [x]  Yes PRE-SURGERY []  ENSURE  [x]  G2   Patient is to be NPO after: 07:15  Dental hx: []  Dentures:  [x]  N/A      []  Bridge or Partial:                   []  Loose or Damaged teeth:   Comments: Patient was given the 5 CHG shower / bath instructions for TKA  surgery along with 2 bottles of the CHG soap. Patient will start this on: 123-19-24   All questions were asked and answered, Patient voiced understanding of this process.   Activity level: Patient is able to climb a flight of stairs without difficulty; [x]  No CP  [x]  No SOB, but would have leg pain.  Patient can perform ADLs without assistance.   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 Patient has not seen a cardiologist. He recent EKG (03-10-23 done at [facility]) is on chart.   Patient denies shortness of breath, fever, cough and chest pain at PAT appointment.  Patient verbalized understanding and agreement to the Pre-Surgical Instructions that were given to them at this PAT appointment. Patient was also educated of the need to review these PAT instructions again prior to his surgery.I reviewed the appropriate phone numbers to call if they have any and questions or concerns.

## 2023-03-20 ENCOUNTER — Encounter (HOSPITAL_COMMUNITY): Payer: Self-pay

## 2023-03-20 ENCOUNTER — Encounter (HOSPITAL_COMMUNITY)
Admission: RE | Admit: 2023-03-20 | Discharge: 2023-03-20 | Disposition: A | Discharge status: Home / Self Care | Payer: Federal, State, Local not specified - PPO | Source: Ambulatory Visit | Attending: Orthopedic Surgery | Admitting: Orthopedic Surgery

## 2023-03-20 ENCOUNTER — Other Ambulatory Visit: Payer: Self-pay

## 2023-03-20 VITALS — BP 140/75 | HR 72 | Temp 98.7°F | Resp 18 | Ht 72.0 in | Wt 241.0 lb

## 2023-03-20 DIAGNOSIS — Z981 Arthrodesis status: Secondary | ICD-10-CM | POA: Diagnosis not present

## 2023-03-20 DIAGNOSIS — Z01818 Encounter for other preprocedural examination: Secondary | ICD-10-CM

## 2023-03-20 DIAGNOSIS — I10 Essential (primary) hypertension: Secondary | ICD-10-CM | POA: Insufficient documentation

## 2023-03-20 DIAGNOSIS — R7303 Prediabetes: Secondary | ICD-10-CM | POA: Diagnosis not present

## 2023-03-20 DIAGNOSIS — G4733 Obstructive sleep apnea (adult) (pediatric): Secondary | ICD-10-CM | POA: Diagnosis not present

## 2023-03-20 DIAGNOSIS — Z79899 Other long term (current) drug therapy: Secondary | ICD-10-CM | POA: Insufficient documentation

## 2023-03-20 DIAGNOSIS — Z01812 Encounter for preprocedural laboratory examination: Secondary | ICD-10-CM | POA: Diagnosis present

## 2023-03-20 DIAGNOSIS — M1712 Unilateral primary osteoarthritis, left knee: Secondary | ICD-10-CM | POA: Insufficient documentation

## 2023-03-20 DIAGNOSIS — Z7982 Long term (current) use of aspirin: Secondary | ICD-10-CM | POA: Insufficient documentation

## 2023-03-20 HISTORY — DX: Prediabetes: R73.03

## 2023-03-20 HISTORY — DX: Unspecified glaucoma: H40.9

## 2023-03-20 HISTORY — DX: Anemia, unspecified: D64.9

## 2023-03-20 LAB — CBC
HCT: 45.2 % (ref 39.0–52.0)
Hemoglobin: 14.2 g/dL (ref 13.0–17.0)
MCH: 28.5 pg (ref 26.0–34.0)
MCHC: 31.4 g/dL (ref 30.0–36.0)
MCV: 90.6 fL (ref 80.0–100.0)
Platelets: 197 10*3/uL (ref 150–400)
RBC: 4.99 MIL/uL (ref 4.22–5.81)
RDW: 14.6 % (ref 11.5–15.5)
WBC: 3.5 10*3/uL — ABNORMAL LOW (ref 4.0–10.5)
nRBC: 0 % (ref 0.0–0.2)

## 2023-03-20 LAB — BASIC METABOLIC PANEL
Anion gap: 6 (ref 5–15)
BUN: 17 mg/dL (ref 8–23)
CO2: 31 mmol/L (ref 22–32)
Calcium: 9.3 mg/dL (ref 8.9–10.3)
Chloride: 101 mmol/L (ref 98–111)
Creatinine, Ser: 1.12 mg/dL (ref 0.61–1.24)
GFR, Estimated: >60 mL/min (ref >60)
Glucose, Bld: 113 mg/dL — ABNORMAL HIGH (ref 70–99)
Potassium: 3.4 mmol/L — ABNORMAL LOW (ref 3.5–5.1)
Sodium: 138 mmol/L (ref 135–145)

## 2023-03-20 LAB — SURGICAL PCR SCREEN
MRSA, PCR: NEGATIVE
Staphylococcus aureus: NEGATIVE

## 2023-03-23 ENCOUNTER — Encounter (HOSPITAL_COMMUNITY): Payer: Self-pay

## 2023-03-23 NOTE — Anesthesia Preprocedure Evaluation (Addendum)
Anesthesia Evaluation  Patient identified by MRN, date of birth, ID band Patient awake    Reviewed: Allergy & Precautions, NPO status , Patient's Chart, lab work & pertinent test results  Airway Mallampati: I       Dental no notable dental hx. (+) Dental Advisory Given   Pulmonary sleep apnea    Pulmonary exam normal breath sounds clear to auscultation       Cardiovascular hypertension, Pt. 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   Abdominal  (+) + obese  Peds  Hematology  (+) Blood dyscrasia, anemia   Anesthesia Other Findings   Reproductive/Obstetrics                             Anesthesia Physical Anesthesia Plan  ASA: 3  Anesthesia Plan: Spinal   Post-op Pain Management: Tylenol PO (pre-op)* and Precedex   Induction: Intravenous  PONV Risk Score and Plan: 2 and Propofol infusion, Treatment may vary due to age or medical condition and Ondansetron  Airway Management Planned: Natural Airway and Simple Face Mask  Additional Equipment: None  Intra-op Plan:   Post-operative Plan:   Informed Consent: I have reviewed the patients History and Physical, chart, labs and discussed the procedure including the risks, benefits and alternatives for the proposed anesthesia with the patient or authorized representative who has indicated his/her understanding and acceptance.     Dental advisory given  Plan Discussed with: CRNA and Anesthesiologist  Anesthesia Plan Comments: (See  PAT note from 12/13 by K Gekas PA-C )        Anesthesia Quick Evaluation

## 2023-03-23 NOTE — Progress Notes (Signed)
Case: [id number] Date/Time: 03/30/23 0858   Procedure: LEFT TOTAL KNEE ARTHROPLASTY (Left: Knee)   Anesthesia type: Spinal   Pre-op diagnosis: left knee osteoarthritis   Location: [facility] ROOM 07 / [facility]   Surgeons: Luci Bank, MD       DISCUSSION: Paul Padilla is a 67 yo male who presents to PAT prior to surgery above. PMH of HTN, OSA (no CPAP use), GERD, pre-diabetes, hx of prostate cancer, arthritis, DDD lumbar spine s/p lumbar fusion (2015).  Patient was seen by PCP on 03/09/23 for pre-op clearance. BP noted to not be at goal and BP meds increased. A1c was 6.7. Cleared for surgery:  "Pre-operative clearance Cleared from a PCP standpoint EKG done-compared to EKG from 2020, similar, no changes. Stable for knee replacement surgery"   VS: BP (!) 140/75 Comment: right arm sitting  Pulse 72   Temp 37.1 C (Oral)   Resp 18   Ht 6' (1.829 m)   Wt 109.3 kg   SpO2 100%   BMI 32.69 kg/m   PROVIDERS: PCP - Gardiner Fanti, PA-C   LABS: Labs reviewed: Acceptable for surgery. Mild leukopenia (chronic), mild hypokalemia (all labs ordered are listed, but only abnormal results are displayed)  Labs Reviewed  BASIC METABOLIC PANEL - Abnormal; Notable for the following components:      Result Value   Potassium 3.4 (*)    Glucose, Bld 113 (*)    All other components within normal limits  CBC - Abnormal; Notable for the following components:   WBC 3.5 (*)    All other components within normal limits  SURGICAL PCR SCREEN     IMAGES:   EKG 03/09/23 ([facility] - report only available)  Sinus rhythm, rate 75 Left atrial enlargement Nonspecific T wave abnormality Borderline Prolonged QT Possible LVH No Significant Change Since Previous Tracing  CV:  Past Medical History:  Diagnosis Date   Anemia    Arthritis    "joints" (06/15/2013)   Chronic lower back pain    Dysrhythmia    irregular heartrate   congenital, 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 Marissa Nestle   PROSTATECTOMY  04/07/2012   "for CA"   VASECTOMY  12/06/1988    MEDICATIONS:  amLODipine (NORVASC) 10 MG tablet   aspirin EC 81 MG tablet   cyanocobalamin (VITAMIN B12) 1000 MCG tablet   hydrochlorothiazide (HYDRODIURIL) 25 MG tablet   ibuprofen (ADVIL) 200 MG tablet   olmesartan (BENICAR) 20 MG tablet   potassium chloride SA (KLOR-CON M) 20 MEQ tablet   rosuvastatin (CRESTOR) 10 MG tablet   No current facility-administered medications for this encounter.   Marcille Blanco [facility] Short Stay/Anesthesiology [facility] Phone [phone] 03/23/2023 1:31 PM

## 2023-03-29 ENCOUNTER — Encounter (HOSPITAL_COMMUNITY): Payer: Self-pay | Admitting: Orthopedic Surgery

## 2023-03-30 ENCOUNTER — Encounter (HOSPITAL_COMMUNITY): Payer: Self-pay | Admitting: Orthopedic Surgery

## 2023-03-30 ENCOUNTER — Ambulatory Visit (HOSPITAL_COMMUNITY)
Admission: RE | Admit: 2023-03-30 | Discharge: 2023-03-31 | Disposition: A | Discharge status: Home / Self Care | Payer: Medicare PPO | Source: Ambulatory Visit | Attending: Orthopedic Surgery | Admitting: Orthopedic Surgery

## 2023-03-30 ENCOUNTER — Other Ambulatory Visit: Payer: Self-pay

## 2023-03-30 ENCOUNTER — Ambulatory Visit (HOSPITAL_COMMUNITY): Payer: Medicare PPO | Admitting: Physician Assistant

## 2023-03-30 ENCOUNTER — Ambulatory Visit (HOSPITAL_COMMUNITY): Payer: Medicare PPO

## 2023-03-30 ENCOUNTER — Encounter (HOSPITAL_COMMUNITY)
Admission: RE | Disposition: A | Discharge status: Home / Self Care | Payer: Self-pay | Source: Ambulatory Visit | Attending: Orthopedic Surgery

## 2023-03-30 ENCOUNTER — Ambulatory Visit (HOSPITAL_COMMUNITY): Payer: Self-pay | Admitting: Certified Registered"

## 2023-03-30 DIAGNOSIS — M1712 Unilateral primary osteoarthritis, left knee: Secondary | ICD-10-CM | POA: Insufficient documentation

## 2023-03-30 DIAGNOSIS — G4730 Sleep apnea, unspecified: Secondary | ICD-10-CM | POA: Insufficient documentation

## 2023-03-30 DIAGNOSIS — E785 Hyperlipidemia, unspecified: Secondary | ICD-10-CM | POA: Insufficient documentation

## 2023-03-30 DIAGNOSIS — I10 Essential (primary) hypertension: Secondary | ICD-10-CM | POA: Diagnosis not present

## 2023-03-30 DIAGNOSIS — Z8546 Personal history of malignant neoplasm of prostate: Secondary | ICD-10-CM | POA: Insufficient documentation

## 2023-03-30 DIAGNOSIS — R262 Difficulty in walking, not elsewhere classified: Secondary | ICD-10-CM | POA: Diagnosis not present

## 2023-03-30 DIAGNOSIS — R7303 Prediabetes: Secondary | ICD-10-CM | POA: Insufficient documentation

## 2023-03-30 DIAGNOSIS — K219 Gastro-esophageal reflux disease without esophagitis: Secondary | ICD-10-CM | POA: Insufficient documentation

## 2023-03-30 DIAGNOSIS — Z96652 Presence of left artificial knee joint: Secondary | ICD-10-CM

## 2023-03-30 HISTORY — PX: TOTAL KNEE ARTHROPLASTY: SHX125

## 2023-03-30 SURGERY — ARTHROPLASTY, KNEE, TOTAL
Anesthesia: Spinal | Site: Knee | Laterality: Left

## 2023-03-30 MED ORDER — MENTHOL 3 MG MT LOZG: 1.0000 | LOZENGE | OROMUCOSAL | Status: DC | PRN

## 2023-03-30 MED ORDER — ASPIRIN 81 MG PO CHEW
81.0000 mg | CHEWABLE_TABLET | Freq: Two times a day (BID) | ORAL | Status: DC
  Administered 2023-03-30 – 2023-03-31 (×2): 81 mg via ORAL
  Filled 2023-03-30 (×2): qty 1

## 2023-03-30 MED ORDER — IRBESARTAN 150 MG PO TABS
150.0000 mg | ORAL_TABLET | Freq: Every day | ORAL | Status: DC
  Administered 2023-03-31: 150 mg via ORAL
  Filled 2023-03-30: qty 1

## 2023-03-30 MED ORDER — TIZANIDINE HCL 2 MG PO TABS: 2.0000 mg | ORAL_TABLET | Freq: Four times a day (QID) | ORAL | 0 refills | Status: AC | PRN

## 2023-03-30 MED ORDER — TRANEXAMIC ACID 1000 MG/10ML IV SOLN
2000.0000 mg | INTRAVENOUS | Status: DC
  Filled 2023-03-30: qty 20

## 2023-03-30 MED ORDER — GLYCOPYRROLATE 0.2 MG/ML IJ SOLN
INTRAMUSCULAR | Status: DC | PRN
  Administered 2023-03-30 (×2): .1 mg via INTRAVENOUS

## 2023-03-30 MED ORDER — HYDROMORPHONE HCL 1 MG/ML IJ SOLN: 0.5000 mg | INTRAMUSCULAR | Status: DC | PRN

## 2023-03-30 MED ORDER — POLYETHYLENE GLYCOL 3350 17 G PO PACK: 17.0000 g | PACK | Freq: Every day | ORAL | Status: DC | PRN

## 2023-03-30 MED ORDER — POVIDONE-IODINE 10 % EX SWAB: 2.0000 | Freq: Once | CUTANEOUS | Status: DC

## 2023-03-30 MED ORDER — PHENOL 1.4 % MT LIQD: 1.0000 | OROMUCOSAL | Status: DC | PRN

## 2023-03-30 MED ORDER — CEFAZOLIN SODIUM-DEXTROSE 2-4 GM/100ML-% IV SOLN
2.0000 g | INTRAVENOUS | Status: AC
  Administered 2023-03-30: 2 g via INTRAVENOUS
  Filled 2023-03-30: qty 100

## 2023-03-30 MED ORDER — BUPIVACAINE LIPOSOME 1.3 % IJ SUSP
INTRAMUSCULAR | Status: DC | PRN
  Administered 2023-03-30: 20 mL

## 2023-03-30 MED ORDER — DOCUSATE SODIUM 100 MG PO CAPS
100.0000 mg | ORAL_CAPSULE | Freq: Two times a day (BID) | ORAL | Status: DC
  Administered 2023-03-30 – 2023-03-31 (×2): 100 mg via ORAL
  Filled 2023-03-30 (×2): qty 1

## 2023-03-30 MED ORDER — TRAMADOL HCL 50 MG PO TABS
50.0000 mg | ORAL_TABLET | Freq: Four times a day (QID) | ORAL | Status: DC
Start: 2023-03-30 — End: 2023-03-31
  Administered 2023-03-30 – 2023-03-31 (×4): 50 mg via ORAL
  Filled 2023-03-30 (×4): qty 1

## 2023-03-30 MED ORDER — LACTATED RINGERS IV SOLN: INTRAVENOUS | Status: DC

## 2023-03-30 MED ORDER — CEFAZOLIN SODIUM-DEXTROSE 1-4 GM/50ML-% IV SOLN
1.0000 g | Freq: Three times a day (TID) | INTRAVENOUS | Status: DC
  Administered 2023-03-30 – 2023-03-31 (×2): 1 g via INTRAVENOUS
  Filled 2023-03-30 (×3): qty 50

## 2023-03-30 MED ORDER — ALUM & MAG HYDROXIDE-SIMETH 200-200-20 MG/5ML PO SUSP: 30.0000 mL | ORAL | Status: DC | PRN

## 2023-03-30 MED ORDER — DEXAMETHASONE SODIUM PHOSPHATE 10 MG/ML IJ SOLN
INTRAMUSCULAR | Status: DC | PRN
  Administered 2023-03-30: 8 mg via INTRAVENOUS

## 2023-03-30 MED ORDER — CEFAZOLIN SODIUM-DEXTROSE 1-4 GM/50ML-% IV SOLN
1.0000 g | Freq: Three times a day (TID) | INTRAVENOUS | Status: DC
  Filled 2023-03-30 (×3): qty 50

## 2023-03-30 MED ORDER — MIDAZOLAM HCL 2 MG/2ML IJ SOLN
INTRAMUSCULAR | Status: DC | PRN
  Administered 2023-03-30: 2 mg via INTRAVENOUS

## 2023-03-30 MED ORDER — PANTOPRAZOLE SODIUM 40 MG PO TBEC
40.0000 mg | DELAYED_RELEASE_TABLET | Freq: Every day | ORAL | Status: DC
  Administered 2023-03-30 – 2023-03-31 (×2): 40 mg via ORAL
  Filled 2023-03-30 (×2): qty 1

## 2023-03-30 MED ORDER — METHOCARBAMOL 500 MG PO TABS
500.0000 mg | ORAL_TABLET | Freq: Four times a day (QID) | ORAL | Status: DC | PRN
  Administered 2023-03-30 – 2023-03-31 (×2): 500 mg via ORAL
  Filled 2023-03-30: qty 1

## 2023-03-30 MED ORDER — TRANEXAMIC ACID-NACL 1000-0.7 MG/100ML-% IV SOLN
INTRAVENOUS | Status: AC
  Filled 2023-03-30: qty 100

## 2023-03-30 MED ORDER — DIPHENHYDRAMINE HCL 12.5 MG/5ML PO ELIX: 12.5000 mg | ORAL_SOLUTION | ORAL | Status: DC | PRN

## 2023-03-30 MED ORDER — FLEET ENEMA RE ENEM: 1.0000 | ENEMA | Freq: Once | RECTAL | Status: DC | PRN

## 2023-03-30 MED ORDER — AMLODIPINE BESYLATE 10 MG PO TABS
10.0000 mg | ORAL_TABLET | Freq: Every day | ORAL | Status: DC
  Administered 2023-03-31: 10 mg via ORAL
  Filled 2023-03-30: qty 1

## 2023-03-30 MED ORDER — ONDANSETRON HCL 4 MG PO TABS: 4.0000 mg | ORAL_TABLET | Freq: Every day | ORAL | 1 refills | Status: AC | PRN

## 2023-03-30 MED ORDER — HYDROCHLOROTHIAZIDE 25 MG PO TABS
25.0000 mg | ORAL_TABLET | Freq: Every day | ORAL | Status: DC
  Administered 2023-03-31: 25 mg via ORAL
  Filled 2023-03-30: qty 1

## 2023-03-30 MED ORDER — ONDANSETRON HCL 4 MG/2ML IJ SOLN
INTRAMUSCULAR | Status: AC
  Filled 2023-03-30: qty 2

## 2023-03-30 MED ORDER — BUPIVACAINE LIPOSOME 1.3 % IJ SUSP
INTRAMUSCULAR | Status: AC
Start: 2023-03-30 — End: ?
  Filled 2023-03-30: qty 20

## 2023-03-30 MED ORDER — METHOCARBAMOL 500 MG PO TABS
ORAL_TABLET | ORAL | Status: AC
  Filled 2023-03-30: qty 1

## 2023-03-30 MED ORDER — LIDOCAINE 2% (20 MG/ML) 5 ML SYRINGE
INTRAMUSCULAR | Status: DC | PRN
  Administered 2023-03-30: 40 mg via INTRAVENOUS

## 2023-03-30 MED ORDER — BUPIVACAINE-EPINEPHRINE (PF) 0.25% -1:200000 IJ SOLN
INTRAMUSCULAR | Status: DC | PRN
  Administered 2023-03-30: 30 mL via PERINEURAL

## 2023-03-30 MED ORDER — ACETAMINOPHEN 500 MG PO TABS: 1000.0000 mg | ORAL_TABLET | Freq: Four times a day (QID) | ORAL | Status: DC

## 2023-03-30 MED ORDER — ONDANSETRON HCL 4 MG/2ML IJ SOLN
4.0000 mg | Freq: Four times a day (QID) | INTRAMUSCULAR | Status: DC | PRN
  Administered 2023-03-30: 4 mg via INTRAVENOUS
  Filled 2023-03-30: qty 2

## 2023-03-30 MED ORDER — DEXAMETHASONE SODIUM PHOSPHATE 10 MG/ML IJ SOLN
10.0000 mg | Freq: Once | INTRAMUSCULAR | Status: AC
  Administered 2023-03-31: 10 mg via INTRAVENOUS
  Filled 2023-03-30: qty 1

## 2023-03-30 MED ORDER — BUPIVACAINE LIPOSOME 1.3 % IJ SUSP: 20.0000 mL | Freq: Once | INTRAMUSCULAR | Status: DC

## 2023-03-30 MED ORDER — PROPOFOL 10 MG/ML IV BOLUS
INTRAVENOUS | Status: AC
  Filled 2023-03-30: qty 20

## 2023-03-30 MED ORDER — METOCLOPRAMIDE HCL 5 MG/ML IJ SOLN
5.0000 mg | Freq: Three times a day (TID) | INTRAMUSCULAR | Status: DC | PRN
Start: 2023-03-30 — End: 2023-03-31

## 2023-03-30 MED ORDER — METHOCARBAMOL 1000 MG/10ML IJ SOLN
500.0000 mg | Freq: Four times a day (QID) | INTRAMUSCULAR | Status: DC | PRN
  Administered 2023-03-30: 500 mg via INTRAVENOUS
  Filled 2023-03-30: qty 10

## 2023-03-30 MED ORDER — OXYCODONE HCL 5 MG PO TABS
5.0000 mg | ORAL_TABLET | ORAL | Status: DC | PRN
  Administered 2023-03-30 – 2023-03-31 (×2): 5 mg via ORAL
  Filled 2023-03-30 (×2): qty 1

## 2023-03-30 MED ORDER — WATER FOR IRRIGATION, STERILE IR SOLN
Status: DC | PRN
  Administered 2023-03-30: 1000 mL

## 2023-03-30 MED ORDER — HYDROMORPHONE HCL 1 MG/ML IJ SOLN: 0.2500 mg | INTRAMUSCULAR | Status: DC | PRN

## 2023-03-30 MED ORDER — FENTANYL CITRATE (PF) 100 MCG/2ML IJ SOLN
INTRAMUSCULAR | Status: DC | PRN
  Administered 2023-03-30: 50 ug via INTRAVENOUS

## 2023-03-30 MED ORDER — SODIUM CHLORIDE (PF) 0.9 % IJ SOLN
INTRAMUSCULAR | Status: AC
  Filled 2023-03-30: qty 40

## 2023-03-30 MED ORDER — BISACODYL 5 MG PO TBEC: 5.0000 mg | DELAYED_RELEASE_TABLET | Freq: Every day | ORAL | Status: DC | PRN

## 2023-03-30 MED ORDER — PROPOFOL 1000 MG/100ML IV EMUL
INTRAVENOUS | Status: AC
  Filled 2023-03-30: qty 100

## 2023-03-30 MED ORDER — OXYCODONE HCL 5 MG PO TABS: 5.0000 mg | ORAL_TABLET | Freq: Once | ORAL | Status: DC | PRN

## 2023-03-30 MED ORDER — ACETAMINOPHEN 500 MG PO TABS
1000.0000 mg | ORAL_TABLET | Freq: Four times a day (QID) | ORAL | Status: AC
  Administered 2023-03-30 – 2023-03-31 (×4): 1000 mg via ORAL
  Filled 2023-03-30 (×4): qty 2

## 2023-03-30 MED ORDER — ASPIRIN 81 MG PO CHEW: 81.0000 mg | CHEWABLE_TABLET | Freq: Two times a day (BID) | ORAL | Status: DC

## 2023-03-30 MED ORDER — ACETAMINOPHEN 500 MG PO TABS
1000.0000 mg | ORAL_TABLET | Freq: Once | ORAL | Status: AC
  Administered 2023-03-30: 1000 mg via ORAL
  Filled 2023-03-30: qty 2

## 2023-03-30 MED ORDER — ASPIRIN 81 MG PO TBEC: 81.0000 mg | DELAYED_RELEASE_TABLET | Freq: Two times a day (BID) | ORAL | 0 refills | Status: AC

## 2023-03-30 MED ORDER — ACETAMINOPHEN 325 MG PO TABS
325.0000 mg | ORAL_TABLET | Freq: Four times a day (QID) | ORAL | Status: DC | PRN
Start: 2023-03-31 — End: 2023-03-30

## 2023-03-30 MED ORDER — OXYCODONE HCL 5 MG PO TABS: 5.0000 mg | ORAL_TABLET | Freq: Four times a day (QID) | ORAL | Status: DC | PRN

## 2023-03-30 MED ORDER — TRANEXAMIC ACID-NACL 1000-0.7 MG/100ML-% IV SOLN
1000.0000 mg | Freq: Once | INTRAVENOUS | Status: AC
  Administered 2023-03-30: 1000 mg via INTRAVENOUS
  Filled 2023-03-30: qty 100

## 2023-03-30 MED ORDER — ONDANSETRON HCL 4 MG/2ML IJ SOLN
INTRAMUSCULAR | Status: DC | PRN
  Administered 2023-03-30: 4 mg via INTRAVENOUS

## 2023-03-30 MED ORDER — LACTATED RINGERS IV BOLUS
500.0000 mL | Freq: Once | INTRAVENOUS | Status: AC
  Administered 2023-03-30: 500 mL via INTRAVENOUS

## 2023-03-30 MED ORDER — LIDOCAINE HCL (PF) 2 % IJ SOLN
INTRAMUSCULAR | Status: AC
  Filled 2023-03-30: qty 5

## 2023-03-30 MED ORDER — ONDANSETRON HCL 4 MG PO TABS: 4.0000 mg | ORAL_TABLET | Freq: Four times a day (QID) | ORAL | Status: DC | PRN

## 2023-03-30 MED ORDER — ONDANSETRON HCL 4 MG/2ML IJ SOLN: 4.0000 mg | Freq: Once | INTRAMUSCULAR | Status: DC | PRN

## 2023-03-30 MED ORDER — OXYCODONE HCL 5 MG PO TABS
5.0000 mg | ORAL_TABLET | ORAL | Status: DC | PRN
  Administered 2023-03-30: 10 mg via ORAL
  Administered 2023-03-30: 5 mg via ORAL

## 2023-03-30 MED ORDER — BUPIVACAINE HCL 0.25 % IJ SOLN
INTRAMUSCULAR | Status: AC
  Filled 2023-03-30: qty 1

## 2023-03-30 MED ORDER — EPHEDRINE 5 MG/ML INJ
INTRAVENOUS | Status: AC
  Filled 2023-03-30: qty 5

## 2023-03-30 MED ORDER — DEXAMETHASONE SODIUM PHOSPHATE 10 MG/ML IJ SOLN
INTRAMUSCULAR | Status: AC
  Filled 2023-03-30: qty 1

## 2023-03-30 MED ORDER — ORAL CARE MOUTH RINSE
15.0000 mL | Freq: Once | OROMUCOSAL | Status: AC
Start: 2023-03-30 — End: 2023-03-30

## 2023-03-30 MED ORDER — OXYCODONE HCL 5 MG PO TABS
ORAL_TABLET | ORAL | Status: AC
  Filled 2023-03-30: qty 1

## 2023-03-30 MED ORDER — OXYCODONE HCL 5 MG PO TABS: 10.0000 mg | ORAL_TABLET | ORAL | Status: DC | PRN

## 2023-03-30 MED ORDER — FENTANYL CITRATE (PF) 100 MCG/2ML IJ SOLN
INTRAMUSCULAR | Status: AC
  Filled 2023-03-30: qty 2

## 2023-03-30 MED ORDER — METOCLOPRAMIDE HCL 5 MG PO TABS
5.0000 mg | ORAL_TABLET | Freq: Three times a day (TID) | ORAL | Status: DC | PRN
Start: 2023-03-30 — End: 2023-03-31

## 2023-03-30 MED ORDER — EPHEDRINE SULFATE-NACL 50-0.9 MG/10ML-% IV SOSY
PREFILLED_SYRINGE | INTRAVENOUS | Status: DC | PRN
  Administered 2023-03-30 (×3): 5 mg via INTRAVENOUS

## 2023-03-30 MED ORDER — CHLORHEXIDINE GLUCONATE 0.12 % MT SOLN
15.0000 mL | Freq: Once | OROMUCOSAL | Status: AC
  Administered 2023-03-30: 15 mL via OROMUCOSAL

## 2023-03-30 MED ORDER — LACTATED RINGERS IV BOLUS
250.0000 mL | Freq: Once | INTRAVENOUS | Status: AC
  Administered 2023-03-30: 250 mL via INTRAVENOUS

## 2023-03-30 MED ORDER — KCL IN DEXTROSE-NACL 20-5-0.45 MEQ/L-%-% IV SOLN
INTRAVENOUS | Status: DC
  Filled 2023-03-30 (×2): qty 1000

## 2023-03-30 MED ORDER — TRANEXAMIC ACID 1000 MG/10ML IV SOLN
INTRAVENOUS | Status: DC | PRN
  Administered 2023-03-30: 2000 mg via TOPICAL

## 2023-03-30 MED ORDER — OXYCODONE HCL 5 MG PO TABS
ORAL_TABLET | ORAL | Status: AC
  Filled 2023-03-30: qty 2

## 2023-03-30 MED ORDER — KETOROLAC TROMETHAMINE 15 MG/ML IJ SOLN
15.0000 mg | Freq: Four times a day (QID) | INTRAMUSCULAR | Status: AC
  Administered 2023-03-30 – 2023-03-31 (×4): 15 mg via INTRAVENOUS
  Filled 2023-03-30 (×4): qty 1

## 2023-03-30 MED ORDER — 0.9 % SODIUM CHLORIDE (POUR BTL) OPTIME
TOPICAL | Status: DC | PRN
  Administered 2023-03-30: 1000 mL

## 2023-03-30 MED ORDER — PHENYLEPHRINE HCL-NACL 20-0.9 MG/250ML-% IV SOLN
INTRAVENOUS | Status: DC | PRN
  Administered 2023-03-30: 20 ug/min via INTRAVENOUS

## 2023-03-30 MED ORDER — OXYCODONE HCL 5 MG/5ML PO SOLN
5.0000 mg | Freq: Once | ORAL | Status: DC | PRN
Start: 2023-03-30 — End: 2023-03-30

## 2023-03-30 MED ORDER — KCL IN DEXTROSE-NACL 20-5-0.45 MEQ/L-%-% IV SOLN: INTRAVENOUS | Status: DC

## 2023-03-30 MED ORDER — PANTOPRAZOLE SODIUM 40 MG PO TBEC: 40.0000 mg | DELAYED_RELEASE_TABLET | Freq: Every day | ORAL | Status: DC

## 2023-03-30 MED ORDER — DROPERIDOL 2.5 MG/ML IJ SOLN: 0.6250 mg | Freq: Once | INTRAMUSCULAR | Status: DC | PRN

## 2023-03-30 MED ORDER — TRANEXAMIC ACID-NACL 1000-0.7 MG/100ML-% IV SOLN: 1000.0000 mg | Freq: Once | INTRAVENOUS | Status: DC

## 2023-03-30 MED ORDER — BUPIVACAINE IN DEXTROSE 0.75-8.25 % IT SOLN
INTRATHECAL | Status: DC | PRN
  Administered 2023-03-30: 2 mL via INTRATHECAL

## 2023-03-30 MED ORDER — TRANEXAMIC ACID-NACL 1000-0.7 MG/100ML-% IV SOLN
1000.0000 mg | INTRAVENOUS | Status: AC
  Administered 2023-03-30 (×2): 1000 mg via INTRAVENOUS
  Filled 2023-03-30: qty 100

## 2023-03-30 MED ORDER — SODIUM CHLORIDE (PF) 0.9 % IJ SOLN
INTRAMUSCULAR | Status: AC
  Filled 2023-03-30: qty 30

## 2023-03-30 MED ORDER — SODIUM CHLORIDE (PF) 0.9 % IJ SOLN
INTRAMUSCULAR | Status: DC | PRN
  Administered 2023-03-30: 30 mL

## 2023-03-30 MED ORDER — PROPOFOL 500 MG/50ML IV EMUL
INTRAVENOUS | Status: DC | PRN
  Administered 2023-03-30: 100 ug/kg/min via INTRAVENOUS

## 2023-03-30 MED ORDER — MIDAZOLAM HCL 2 MG/2ML IJ SOLN
INTRAMUSCULAR | Status: AC
Start: 2023-03-30 — End: ?
  Filled 2023-03-30: qty 2

## 2023-03-30 MED ORDER — OXYCODONE-ACETAMINOPHEN 5-325 MG PO TABS: 1.0000 | ORAL_TABLET | ORAL | 0 refills | Status: AC | PRN

## 2023-03-30 MED ORDER — DOCUSATE SODIUM 100 MG PO CAPS: 100.0000 mg | ORAL_CAPSULE | Freq: Two times a day (BID) | ORAL | Status: DC

## 2023-03-30 SURGICAL SUPPLY — 39 items
ATTUNE MED DOME PAT 41 KNEE (Knees) IMPLANT
BAG COUNTER SPONGE SURGICOUNT (BAG) IMPLANT
BAG DECANTER FOR FLEXI CONT (MISCELLANEOUS) ×1 IMPLANT
BAG ZIPLOCK 12X15 (MISCELLANEOUS) ×1 IMPLANT
BASEPLATE TIB ATT KNEE 9 (Joint) IMPLANT
BLADE SAG 18X100X1.27 (BLADE) ×1 IMPLANT
BLADE SAW SGTL 11.0X1.19X90.0M (BLADE) ×1 IMPLANT
BNDG ELASTIC 6X10 VLCR STRL LF (GAUZE/BANDAGES/DRESSINGS) ×1 IMPLANT
BOWL SMART MIX CTS (DISPOSABLE) ×1 IMPLANT
CEMENT BONE SMARTSET CMW 20G (Cement) IMPLANT
COMP FEM ATTUNE PS 7 LT (Joint) ×1 IMPLANT
COMPONENT FEM ATTUNE PS 7 LT (Joint) IMPLANT
COVER SURGICAL LIGHT HANDLE (MISCELLANEOUS) ×1 IMPLANT
DRAPE INCISE IOBAN 66X45 STRL (DRAPES) IMPLANT
DRAPE U-SHAPE 47X51 STRL (DRAPES) ×1 IMPLANT
DRSG AQUACEL AG ADV 3.5X10 (GAUZE/BANDAGES/DRESSINGS) ×1 IMPLANT
DURAPREP 26ML APPLICATOR (WOUND CARE) ×1 IMPLANT
ELECT REM PT RETURN 15FT ADLT (MISCELLANEOUS) ×1 IMPLANT
GLOVE BIO SURGEON STRL SZ7.5 (GLOVE) ×1 IMPLANT
GLOVE BIO SURGEON STRL SZ8 (GLOVE) IMPLANT
GLOVE BIOGEL PI IND STRL 8 (GLOVE) ×1 IMPLANT
GOWN STRL REUS W/ TWL LRG LVL3 (GOWN DISPOSABLE) ×1 IMPLANT
HOOD PEEL AWAY T7 (MISCELLANEOUS) ×3 IMPLANT
INSERT MED ATTUNE KNEE 7 6 LT (Insert) IMPLANT
KIT TURNOVER KIT A (KITS) IMPLANT
NS IRRIG 1000ML POUR BTL (IV SOLUTION) ×1 IMPLANT
PACK TOTAL KNEE CUSTOM (KITS) ×1 IMPLANT
PIN FIX SIGMA LCS THRD HI (PIN) IMPLANT
PROTECTOR NERVE ULNAR (MISCELLANEOUS) ×1 IMPLANT
SET HNDPC FAN SPRY TIP SCT (DISPOSABLE) ×1 IMPLANT
SUT VIC AB 1 CTX36XBRD ANBCTR (SUTURE) ×1 IMPLANT
SUT VICRYL+ 3-0 36IN CT-1 (SUTURE) ×3 IMPLANT
SYR CONTROL 10ML LL (SYRINGE) ×2 IMPLANT
TOWEL GREEN STERILE FF (TOWEL DISPOSABLE) IMPLANT
TRAY CATH INTERMITTENT SS 16FR (CATHETERS) ×1 IMPLANT
TRAY FOLEY MTR SLVR 16FR STAT (SET/KITS/TRAYS/PACK) IMPLANT
TUBE SUCTION HIGH CAP CLEAR NV (SUCTIONS) ×1 IMPLANT
WATER STERILE IRR 1000ML POUR (IV SOLUTION) ×2 IMPLANT
WRAP KNEE MAXI GEL POST OP (GAUZE/BANDAGES/DRESSINGS) ×1 IMPLANT

## 2023-03-30 NOTE — Anesthesia Procedure Notes (Signed)
Procedure Name: MAC Date/Time: 03/30/2023 9:52 AM  Performed by: Sindy Guadeloupe, CRNAPre-anesthesia Checklist: Patient identified, Emergency Drugs available, Suction available, Patient being monitored and Timeout performed Oxygen Delivery Method: Simple face mask Placement Confirmation: positive ETCO2

## 2023-03-30 NOTE — Transfer of Care (Signed)
Immediate Anesthesia Transfer of Care Note  Patient: Paul Padilla  Procedure(s) Performed: LEFT TOTAL KNEE ARTHROPLASTY (Left: Knee)  Patient Location: PACU  Anesthesia Type:Spinal  Level of Consciousness: drowsy and responds to stimulation  Airway & Oxygen Therapy: Patient Spontanous Breathing and Patient connected to face mask oxygen  Post-op Assessment: Report given to RN and Post -op Vital signs reviewed and stable  Post vital signs: Reviewed and stable  Last Vitals:  Vitals Value Taken Time  BP 93/61 03/30/23 1253  Temp    Pulse 75 03/30/23 1255  Resp 13 03/30/23 1255  SpO2 98 % 03/30/23 1255  Vitals shown include unfiled device data.  Last Pain:  Vitals:   03/30/23 0808  TempSrc:   PainSc: 0-No pain      Patients Stated Pain Goal: 3 (03/30/23 1610)  Complications: No notable events documented.

## 2023-03-30 NOTE — Evaluation (Signed)
Physical Therapy Evaluation Patient Details Name: Paul Padilla MRN: [id number] DOB: Jan 12, 1956 Today's Date: 03/30/2023  History of Present Illness  67 yo male presents to therapy s/p L TKA on 03/30/2023 due to failure of conservative measures. Pt PMH includes but is not limited to: anemia, chronic LBP, dysrhythmia, GERD, glaucoma, HTN, prostate ca, OSA, and lumbar surgery.  Clinical Impression      Paul Padilla is a 67 y.o. male POD 0 s/p L TKA. Patient reports IND with mobility at baseline. Patient is now limited by functional impairments (see PT problem list below) and requires CGA for supine to sit and min A for sit to supine for bed mobility and CGA for transfers. Patient was able to ambulate 6 feet with RW and CGA level of assist. Patient instructed in exercise to facilitate ROM and circulation to manage edema in supine and HO provided. Pt initially scheduled for same day d/c and due to elevated pain response pt electing to be admitted to hospital. RW provided in PACU at time of eval. Patient will benefit from continued skilled PT interventions to address impairments and progress towards PLOF. Acute PT will follow to progress mobility and stair training in preparation for safe discharge home with family support.     If plan is discharge home, recommend the following: A little help with walking and/or transfers;A little help with bathing/dressing/bathroom;Assistance with cooking/housework;Assist for transportation;Help with stairs or ramp for entrance   Can travel by private vehicle        Equipment Recommendations Rolling walker (2 wheels)  Recommendations for Other Services       Functional Status Assessment Patient has had a recent decline in their functional status and demonstrates the ability to make significant improvements in function in a reasonable and predictable amount of time.     Precautions / Restrictions Precautions Precautions: Knee;Fall Restrictions Weight  Bearing Restrictions Per Provider Order: No      Mobility  Bed Mobility Overal bed mobility: Needs Assistance Bed Mobility: Supine to Sit, Sit to Supine     Supine to sit: Contact guard, HOB elevated Sit to supine: Min assist   General bed mobility comments: cues and min A for sit to supine    Transfers Overall transfer level: Needs assistance Equipment used: Rolling walker (2 wheels) Transfers: Sit to/from Stand Sit to Stand: Contact guard assist, From elevated surface           General transfer comment: cues for proper UE and AD placement pt indicated feeling a little dizzy seated EOB and in standing    Ambulation/Gait Ambulation/Gait assistance: Contact guard assist Gait Distance (Feet): 6 Feet Assistive device: Rolling walker (2 wheels) Gait Pattern/deviations: Step-to pattern, Antalgic, Trunk flexed Gait velocity: decreased     General Gait Details: B UE support at RW to offload L LE in stance phase, minimal B foot clearance and cues for [employer]  Stairs            Wheelchair Mobility     Tilt Bed    Modified Rankin (Stroke Patients Only)       Balance Overall balance assessment: Needs assistance Sitting-balance support: Feet supported Sitting balance-Leahy Scale: Good     Standing balance support: Bilateral upper extremity supported, During functional activity, Reliant on assistive device for balance Standing balance-Leahy Scale: Poor                               Pertinent Vitals/Pain  Pain Assessment Pain Assessment: 0-10 Pain Score: 9  Pain Location: L knee Pain Descriptors / Indicators: Constant, Discomfort, Dull, Grimacing, Operative site guarding Pain Intervention(s): Limited activity within patient's tolerance, Monitored during session, Premedicated before session, Repositioned, Ice applied    Home Living Family/patient expects to be discharged to:: Private residence Living Arrangements: Spouse/significant  other;Children Available Help at Discharge: Family Type of Home: House Home Access: Stairs to enter Entrance Stairs-Rails: None Entrance Stairs-Number of Steps: 2   Home Layout: One level Home Equipment: Rollator (4 wheels);Cane - single point      Prior Function Prior Level of Function : Independent/Modified Independent;Driving;Working/employed             Mobility Comments: IND no AD with all ADLs, self care tasks, IADLs       Extremity/Trunk Assessment        Lower Extremity Assessment Lower Extremity Assessment: LLE deficits/detail LLE Deficits / Details: ankle DF/PF 5/5, SLR < 10 degree lag LLE Sensation: WNL    Cervical / Trunk Assessment Cervical / Trunk Assessment: Back Surgery  Communication   Communication Communication: No apparent difficulties  Cognition Arousal: Alert Behavior During Therapy: WFL for tasks assessed/performed Overall Cognitive Status: Within Functional Limits for tasks assessed                                          General Comments      Exercises Total Joint Exercises Ankle Circles/Pumps: AROM, Both, 10 reps Quad Sets: AROM, Left, 5 reps Short Arc Quad: AROM, Left, 5 reps Heel Slides: AROM, Left, 5 reps Hip ABduction/ADduction: AROM, Left, 5 reps Straight Leg Raises: AROM, Left, 5 reps   Assessment/Plan    PT Assessment Patient needs continued PT services  PT Problem List Decreased strength;Decreased range of motion;Decreased activity tolerance;Decreased mobility;Decreased balance;Decreased coordination;Pain;Decreased knowledge of use of DME       PT Treatment Interventions DME instruction;Gait training;Stair training;Functional mobility training;Therapeutic activities;Therapeutic exercise;Balance training;Neuromuscular re-education;Patient/family education;Modalities    PT Goals (Current goals can be found in the Care Plan section)  Acute Rehab PT Goals Patient Stated Goal: golf PT Goal Formulation:  With patient Time For Goal Achievement: 04/17/23 Potential to Achieve Goals: Good    Frequency 7X/week     Co-evaluation               AM-PAC PT "6 Clicks" Mobility  Outcome Measure Help needed turning from your back to your side while in a flat bed without using bedrails?: A Little Help needed moving from lying on your back to sitting on the side of a flat bed without using bedrails?: A Little Help needed moving to and from a bed to a chair (including a wheelchair)?: A Little Help needed standing up from a chair using your arms (e.g., wheelchair or bedside chair)?: A Little Help needed to walk in hospital room?: A Little Help needed climbing 3-5 steps with a railing? : Total 6 Click Score: 16    End of Session Equipment Utilized During Treatment: Gait belt Activity Tolerance: Treatment limited secondary to medical complications (Comment);Patient limited by pain;Patient limited by fatigue (reports of dizzines) Patient left: in bed;with call bell/phone within reach;with chair alarm set;with family/visitor present Nurse Communication: Mobility status;Other (comment) (pt not ready for same day d/c and going to be admitted to hospital) PT Visit Diagnosis: Unsteadiness on feet (R26.81);Other abnormalities of gait and mobility (R26.89);Muscle weakness (generalized) (  M62.81);Difficulty in walking, not elsewhere classified (R26.2);Pain Pain - Right/Left: Left Pain - part of body: Knee;Leg    Time: 1308-6578 PT Time Calculation (min) (ACUTE ONLY): 42 min   Charges:   PT Evaluation $PT Eval Low Complexity: 1 Low PT Treatments $Therapeutic Exercise: 8-22 mins $Therapeutic Activity: 8-22 mins PT General Charges $$ ACUTE PT VISIT: 1 Visit         Johnny Bridge, PT Acute Rehab   Jacqualyn Posey 03/30/2023, 5:20 PM

## 2023-03-30 NOTE — Discharge Instructions (Addendum)

## 2023-03-30 NOTE — Progress Notes (Signed)
Orthopedic Tech Progress Note Patient Details:  Paul Padilla 06/18/1955 [id number]  Dropped off bone foam at bedside with PACU RN. Ortho Devices Type of Ortho Device: Bone foam zero knee Ortho Device/Splint Interventions: Floria Raveling 03/30/2023, 1:46 PM

## 2023-03-30 NOTE — Anesthesia Postprocedure Evaluation (Signed)
Anesthesia Post Note  Patient: Paul Padilla  Procedure(s) Performed: LEFT TOTAL KNEE ARTHROPLASTY (Left: Knee)     Patient location during evaluation: PACU Anesthesia Type: Spinal Level of consciousness: oriented and awake and alert Pain management: pain level controlled Vital Signs Assessment: post-procedure vital signs reviewed and stable Respiratory status: spontaneous breathing, respiratory function stable and nonlabored ventilation Cardiovascular status: blood pressure returned to baseline and stable Postop Assessment: no headache, no backache, no apparent nausea or vomiting and spinal receding Anesthetic complications: no   No notable events documented.  Last Vitals:  Vitals:   03/30/23 1340 03/30/23 1345  BP:  116/82  Pulse:  90  Resp:  (!) 22  Temp:    SpO2: 100% 93%    Last Pain:  Vitals:   03/30/23 1330  TempSrc:   PainSc: Asleep                 [name] A.

## 2023-03-30 NOTE — Op Note (Signed)
Marland KitchenPATIENT ID:      Paul Padilla  MRN:     [id number] DOB/AGE:    65/65/1957 / 65 y.o.       OPERATIVE REPORT   DATE OF PROCEDURE:  03/30/2023      PREOPERATIVE DIAGNOSIS:   left knee osteoarthritis      Estimated body mass index is 32.69 kg/m as calculated from the following:   Height as of this encounter: 6' (1.829 m).   Weight as of this encounter: 109.3 kg.                                                       POSTOPERATIVE DIAGNOSIS:   Same                                                                  PROCEDURE:  Procedure(s): LEFT TOTAL KNEE ARTHROPLASTY Using DepuyAttune fixed bearing implants #7 Femur, #9Tibia, 6 mm Attune fixed bearing medial congruent bearing, 41mm Patella    SURGEON: Luci Bank  ASSISTANT:   Tomi Likens. [employer]   (Present and scrubbed throughout the case, critical for assistance with exposure, retraction, instrumentation, and closure.)        ANESTHESIA: Spinal, 20cc Exparel, 50cc 0.25% Marcaine EBL: 100 cc FLUID REPLACEMENT: 1000 cc crystaloid TOURNIQUET: 92 min DRAINS: None TRANEXAMIC ACID: 2gm IV, 1gm topical COMPLICATIONS:  None         INDICATIONS FOR PROCEDURE: The patient has  left knee osteoarthritis, varus deformities, XR shows bone on bone arthritis. Patient has failed all conservative measures including anti-inflammatory medicines, narcotics, attempts at exercise and weight loss, cortisone injections and viscosupplementation.  Risks and benefits of surgery have been discussed, questions answered.  These risks include but are not limited to infection, bleeding, DVT, stiffness, fracture, need for additional surgery, failure of implant, need for assistive device and cardiopulmonary complications for anesthesia.  The patient understands this and wished proceed with surgery.  DESCRIPTION OF PROCEDURE: The patient identified by armband, received  IV antibiotics, in the holding area at [facility]. Patient taken to the operating room,  appropriate anesthetic monitors were attached, and spinal anesthesia was  induced. IV Tranexamic acid was given. Lateral post and 2 surefoot positioners applied to the table, the lower extremity was then prepped and draped in usual sterile fashion from the toes to the high thigh. Time-out procedure was performed. Tomi Likens. [facility] PAC, was present and scrubbed throughout the case, critical for assistance with, positioning, exposure, retraction, instrumentation, and closure. We began the operation, with the knee flexed 90 degrees, by making the anterior midline incision starting at handbreadth above the patella going over the patella 1 cm medial to and 4 cm distal to the tibial tubercle. Small bleeders in the skin and the subcutaneous tissue identified and cauterized. Transverse retinaculum was incised and reflected medially and a medial parapatellar arthrotomy was accomplished. the patella was everted and theprepatellar fat pad resected. The superficial medial collateral ligament was then elevated from anterior to posterior along the proximal flare of the tibia and anterior half of the menisci resected. The knee  was hyperflexed exposing bone on bone arthritis. Peripheral and notch osteophytes as well as the cruciate ligaments were then resected. We then entered the distal femur 2 mm anterior to the PCL origin with the starter drill, followed by the intramedullary guide rod and applied the distal femoral cutting guide set at 9 mm, with 5 degrees of valgus. This was pinned along the epicondylar axis. At this point, the distal femoral cut was accomplished without difficulty.   We then continued to work our way around posteriorly along the proximal tibia, and externally rotated the tibia subluxing it out from underneath the femur. A McHale PCL retractor was placed through the notch, a lateral Hohmann retractor, and anterolateral small homan retractor placed. We then entered the proximal tibia with the Depuy starter  drill in line with the axis of the tibia followed by an extramedullary guide rod and 5-degree posterior slope cutting guide. The tibial cutting guide, was pinned into place allowing resection of 2 mm of bone medially and 9 mm of bone laterally. Our extension gap was checked with a spacer block and deemed adequate.  Satisfied with the tibial resection,We then sized for a #7 femoral component and pinned the chamfer guide in 3 degrees of external rotation. The anterior, posterior, and chamfer cuts were accomplished without difficulty followed by the Attune fixed bearing CR medial congruent bearing sulcus cut. Osteophytes from the posterior femoral condyles. The posterior capsule was injected with Exparel solution. The knee was brought into full extension. We checked our extension gap and fit a 6 mm trial lollipop. Distracting in extension with a lamina spreader,  bleeders in the posterior capsule, Posterior medial and posterior lateral gutter were cauterized.  The transexamic acid-soaked sponge was then placed in the gap of the knee in extension. The knee was flexed 30. The posterior patella cut was accomplished with the 9.5 mm Attune cutting guide, sized for a 41 mm dome, and the fixation pegs drilled.The knee was then once again hyperflexed exposing the proximal tibia. We sized for a # 9 tibial base plate, applied the smokestack and the conical reamer followed by the the Delta fin keel punch. We then hammered into place the Attune CR trial femoral component, drilled the lugs, inserted a  6 mm trial bearing, trial patellar button, and took the knee through range of motion from 0-130 degrees. Medial and lateral ligamentous stability was checked. No thumb pressure was required for patellar Tracking.  All trial components were removed, mating surfaces irrigated with pulse lavage, and dried with suction and sponges. 10 cc of the Exparel solution was applied to the cancellus bone of the patella distal femur and proximal  tibia.  After waiting 30 seconds, the bony surfaces were again, dried with sponges. The bone was deemed sufficient for press fit fixation of the implants. The tibia base plate and femoral component were hammered into place. A single batch of DePuy HV was mixed and used for fixation of the patella implant. Excess cement was removed. The final Attune CR fixed medial congruent bearing was inserted, and the knee brought to full extension with compression. The patellar button was clamped into place, and excess cement removed. The knee was held at 30 flexion with compression using the second surefoot, while the cement cured. The wound was irrigated out with normal saline solution pulse lavage. The rest of the Exparel was injected into the parapatellar arthrotomy, subcutaneous tissues, and periosteal tissues. The parapatellar arthrotomy was closed with running #1 Vicryl suture. The subcutaneous tissue  with 3-0 undyed Vicryl suture, and the skin with running 3-0 SQ vicryl. An Aquacil dressing and Ace wrap were applied. The patient was taken to recovery room without difficulty.   Leta Baptist Lekendrick Alpern 03/30/2023, 12:12 PM

## 2023-03-30 NOTE — Care Plan (Signed)
Please cancel admit order. Patient is s/p left TKA and is being observed overnight for pain control with plan for discharge tomorrow

## 2023-03-30 NOTE — Anesthesia Procedure Notes (Signed)
Spinal  Patient location during procedure: OR Start time: 03/30/2023 9:55 AM End time: 03/30/2023 9:58 AM Reason for block: surgical anesthesia Staffing Performed: anesthesiologist  Anesthesiologist: Mal Amabile, MD Performed by: Mal Amabile, MD Authorized by: Mal Amabile, MD   Preanesthetic Checklist Completed: patient identified, IV checked, site marked, risks and benefits discussed, surgical consent, monitors and equipment checked, pre-op evaluation and timeout performed Spinal Block Patient position: sitting Prep: DuraPrep and site prepped and draped Patient monitoring: heart rate, cardiac monitor, continuous pulse ox and blood pressure Approach: midline Location: L3-4 Injection technique: single-shot Needle Needle type: Pencan  Needle gauge: 24 G Needle length: 9 cm Needle insertion depth: 8 cm Assessment Sensory level: T6 Events: CSF return Additional Notes Patient tolerated procedure well. Adequate sensory level.

## 2023-03-30 NOTE — H&P (Addendum)
Paul Padilla is an 67 y.o. male.   Chief Complaint: Left knee pain HPI: Mr. Paul Padilla is a 67 year old male with long standing left knee pain. He has undergone extensive non operative treatment with injections, medications and previous arthroscopy. His imaging demonstrated end-stage degenerative osteoarthritis changes. He states his pain is severe at times and is impacting his ADLs and his ability to play golf without pain.  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 Marissa Nestle   PROSTATECTOMY  04/07/2012   "for CA"   VASECTOMY  12/06/1988    Family History  Problem Relation Age of Onset   Hypertension Mother    Glaucoma Mother    Cataracts Mother    Heart disease Father    Heart disease Brother    Prostate cancer Brother    Social History:  reports that he has never smoked. He has never used smokeless tobacco. He reports that he does not drink alcohol and does not use drugs.  Allergies:  Allergies  Allergen Reactions   Dorzolamide Hcl-Timolol Mal     Redness, burning and irritation    Medications Prior to Admission  Medication Sig Dispense Refill   amLODipine (NORVASC) 10 MG tablet Take 10 mg by mouth daily.     hydrochlorothiazide (HYDRODIURIL) 25 MG tablet Take 1 tablet by mouth daily.     ibuprofen (ADVIL) 200 MG tablet Take 600 mg by mouth every 6 (six) hours as needed for moderate pain (pain score 4-6).     olmesartan (BENICAR) 20 MG tablet  Take 20 mg by mouth daily.     potassium chloride SA (KLOR-CON M) 20 MEQ tablet Take 1 tablet by mouth daily.     rosuvastatin (CRESTOR) 10 MG tablet Take 10 mg by mouth daily.     aspirin EC 81 MG tablet Take 81 mg by mouth daily. Swallow whole.     cyanocobalamin (VITAMIN B12) 1000 MCG tablet Take 1,000 mcg by mouth daily.      No results found for this or any previous visit (from the past 48 hours). No results found.  Review of Systems  Constitutional: Negative.   HENT: Negative.    Respiratory: Negative.    Cardiovascular: Negative.   Gastrointestinal: Negative.   Musculoskeletal:  Positive for arthralgias, gait problem and joint swelling.  Psychiatric/Behavioral: Negative.      Blood pressure (!) 150/83, pulse 75, temperature 98.1 F (36.7 C), temperature source Oral, resp. rate 18, height 6' (1.829 m), weight 109.3 kg, SpO2 100%. Physical Exam Constitutional:      Appearance: Normal appearance.  HENT:     Head: Normocephalic and atraumatic.     Mouth/Throat:     Mouth: Mucous membranes are moist.  Cardiovascular:     Rate and Rhythm: Normal rate and regular rhythm.  Pulses: Normal pulses.     Heart sounds: Normal heart sounds.  Pulmonary:     Effort: Pulmonary effort is normal.     Breath sounds: Normal breath sounds.  Abdominal:     General: Abdomen is flat.     Palpations: Abdomen is soft.  Musculoskeletal:     Comments: LLE Mild effusion ROM 5-110 Slight varus alignment Meidal joint line pain  Skin:    Capillary Refill: Capillary refill takes less than 2 seconds.  Neurological:     General: No focal deficit present.     Mental Status: He is alert and oriented to person, place, and time.  Psychiatric:        Mood and Affect: Mood normal.        Behavior: Behavior normal.      Assessment/Plan Mr. Steinberger is a 67 year old male with left knee end state osteoarthritis that has failed conservative treatment and is affecting his quality of life.  Plan  for left total knee arthroplasty  Risk and benefits of surgery discussed and all questions answered. These risks include but are not limited to infection, bleeding, damage to neurovascular structures, failure to heal, stiffness, DVT, fracture, implant failure, need for further surgery and cardiopulmonary complications from anesthesia. The patient understands these risks and wishes to proceed with surgery.  Likely plan for outpatient surgery pending how patient is feeling post op    Luci Bank, MD 03/30/2023, 7:48 AM

## 2023-03-31 ENCOUNTER — Encounter (HOSPITAL_COMMUNITY): Payer: Self-pay | Admitting: Orthopedic Surgery

## 2023-03-31 DIAGNOSIS — M1712 Unilateral primary osteoarthritis, left knee: Secondary | ICD-10-CM | POA: Diagnosis not present

## 2023-03-31 NOTE — Progress Notes (Signed)
Physical Therapy Treatment Patient Details Name: Paul Padilla MRN: [id number] DOB: 1955/10/18 Today's Date: 03/31/2023   History of Present Illness 67 yo male presents to therapy s/p L TKA on 03/30/2023 due to failure of conservative measures. Pt PMH includes but is not limited to: anemia, chronic LBP, dysrhythmia, GERD, glaucoma, HTN, prostate ca, OSA, and lumbar surgery.    PT Comments  Pt making excellent progress this am, pain controlled with mobility. Will see for a second session and pt will likely be ready to d/c later today from PT standpoint.    If plan is discharge home, recommend the following: A little help with walking and/or transfers;A little help with bathing/dressing/bathroom;Assistance with cooking/housework;Assist for transportation;Help with stairs or ramp for entrance   Can travel by private vehicle        Equipment Recommendations  Rolling walker (2 wheels)    Recommendations for Other Services       Precautions / Restrictions Precautions Precautions: Knee;Fall Restrictions Weight Bearing Restrictions Per Provider Order: No     Mobility  Bed Mobility Overal bed mobility: Needs Assistance Bed Mobility: Supine to Sit     Supine to sit: Supervision     General bed mobility comments: for safety    Transfers Overall transfer level: Needs assistance Equipment used: Rolling walker (2 wheels) Transfers: Sit to/from Stand Sit to Stand: Supervision           General transfer comment: cues for proper UE and LLE position    Ambulation/Gait Ambulation/Gait assistance: Contact guard assist, Supervision Gait Distance (Feet): 120 Feet Assistive device: Rolling walker (2 wheels) Gait Pattern/deviations: Step-to pattern, Antalgic, Trunk flexed, Step-through pattern Gait velocity: decreased     General Gait Details: cues for initial sequence, progression to step through with improved wt shift to LLE with incr distance   Stairs              Wheelchair Mobility     Tilt Bed    Modified Rankin (Stroke Patients Only)       Balance Overall balance assessment: Needs assistance Sitting-balance support: Feet supported Sitting balance-Leahy Scale: Good     Standing balance support: During functional activity, Reliant on assistive device for balance Standing balance-Leahy Scale: Good                              Cognition Arousal: Alert Behavior During Therapy: WFL for tasks assessed/performed Overall Cognitive Status: Within Functional Limits for tasks assessed                                          Exercises Total Joint Exercises Ankle Circles/Pumps: AROM, Both, 10 reps Quad Sets: AROM, 10 reps, Both Heel Slides: AROM, Left, 10 reps, AAROM Straight Leg Raises: AROM, Left, 10 reps Long Arc Quad: AROM, Strengthening, Left, 5 reps, Seated Knee Flexion: AAROM, AROM, Left, 5 reps, Seated Goniometric ROM: grossly 6 to 85 degrees L knee flexion    General Comments        Pertinent Vitals/Pain Pain Assessment Pain Assessment: 0-10 Pain Score: 3  Pain Location: L knee Pain Descriptors / Indicators: Discomfort, Sore Pain Intervention(s): Limited activity within patient's tolerance, Monitored during session, Premedicated before session, Repositioned    Home Living  Prior Function            PT Goals (current goals can now be found in the care plan section) Acute Rehab PT Goals Patient Stated Goal: golf PT Goal Formulation: With patient Time For Goal Achievement: 04/17/23 Potential to Achieve Goals: Good Progress towards PT goals: Progressing toward goals    Frequency    7X/week      PT Plan      Co-evaluation              AM-PAC PT "6 Clicks" Mobility   Outcome Measure  Help needed turning from your back to your side while in a flat bed without using bedrails?: A Little Help needed moving from lying on your back to  sitting on the side of a flat bed without using bedrails?: A Little Help needed moving to and from a bed to a chair (including a wheelchair)?: A Little Help needed standing up from a chair using your arms (e.g., wheelchair or bedside chair)?: A Little Help needed to walk in hospital room?: A Little Help needed climbing 3-5 steps with a railing? : A Little 6 Click Score: 18    End of Session Equipment Utilized During Treatment: Gait belt Activity Tolerance: Patient tolerated treatment well (reports of dizzines) Patient left: with call bell/phone within reach;with chair alarm set;with family/visitor present;in chair Nurse Communication: Mobility status PT Visit Diagnosis: Unsteadiness on feet (R26.81);Other abnormalities of gait and mobility (R26.89);Muscle weakness (generalized) (M62.81);Difficulty in walking, not elsewhere classified (R26.2);Pain Pain - Right/Left: Left Pain - part of body: Knee;Leg     Time: 2202-5427 PT Time Calculation (min) (ACUTE ONLY): 25 min  Charges:    $Gait Training: 8-22 mins $Therapeutic Exercise: 8-22 mins PT General Charges $$ ACUTE PT VISIT: 1 Visit                     Janny Crute, PT  Acute Rehab Dept [facility]) [phone]  03/31/2023    [facility] 03/31/2023, 11:26 AM

## 2023-03-31 NOTE — Discharge Summary (Signed)
.Patient ID: Paul Padilla MRN: [id number] DOB/AGE: 1955-04-16 67 y.o.  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 male who was admitted 03/30/2023 following a left total knee arthroplasty. Patient admitted over night for pain control following surgery. Pain improved and mobilizing well with physical therapy and is stable for discharge.   Patient was given perioperative antibiotics:  Anti-infectives (From admission, onward)    Start     Dose/Rate Route Frequency Ordered Stop   03/30/23 2000  ceFAZolin (ANCEF) IVPB 1 g/50 mL premix        1 g 100 mL/hr over 30 Minutes Intravenous Every 8 hours 03/30/23 1856 03/31/23 2159   03/30/23 1800  ceFAZolin (ANCEF) IVPB 1 g/50 mL premix  Status:  Discontinued        1 g 100 mL/hr over 30 Minutes Intravenous Every 8 hours 03/30/23 1617 03/30/23 1856   03/30/23 0800  ceFAZolin (ANCEF) IVPB 2g/100 mL premix        2 g 200 mL/hr over 30 Minutes Intravenous On call to O.R. 03/30/23 0758 03/30/23 1009        Patient was given sequential compression devices, early ambulation, and chemoprophylaxis to prevent DVT.  Patient benefited maximally from hospital stay and there were no complications.    Recent vital signs: Patient Vitals for the past 24 hrs:  BP Temp Temp src Pulse Resp SpO2  03/31/23 0552  117/63 98.3 F (36.8 C) Oral 77 17 97 %  03/31/23 0127 122/64 97.8 F (36.6 C) Oral 77 16 97 %  03/30/23 2039 135/69 97.7 F (36.5 C) Oral 77 18 94 %  03/30/23 1951 (!) 132/59 (!) 97.5 F (36.4 C) Oral 85 18 99 %  03/30/23 1830 (!) 140/71 97.7 F (36.5 C) Oral 87 16 99 %  03/30/23 1729 133/80 98.4 F (36.9 C) Oral 70 18 100 %  03/30/23 1445 136/79 (!) 97.4 F (36.3 C) -- 81 -- 92 %  03/30/23 1430 127/77 97.6 F (36.4 C) -- 73 12 98 %  03/30/23 1415 119/73 -- -- 71 11 96 %  03/30/23 1400 116/67 -- -- 79 20 95 %  03/30/23 1345 116/82 -- -- 90 (!) 22 93 %  03/30/23 1340 -- -- -- -- -- 100 %  03/30/23 1330 109/74 -- -- 75 16 100 %  03/30/23 1315 95/66 -- -- 77 14 98 %  03/30/23 1305 101/66 -- -- 77 13 100 %  03/30/23 1300 (!) 95/59 -- -- 83 12 97 %  03/30/23 1255 93/61 -- -- 75 13 98 %  03/30/23 1253 93/61 97.8 F (36.6 C) -- 77 13 96 %     Recent laboratory studies: No results for input(s): "WBC", "HGB", "HCT", "PLT", "NA", "K", "CL", "CO2", "BUN", "CREATININE", "GLUCOSE", "INR", "CALCIUM"  in the last 72 hours.  Invalid input(s): "PT", "2"   Discharge Medications:   Allergies as of 03/31/2023       Reactions   Dorzolamide Hcl-timolol Mal    Redness, burning and irritation        Medication List     STOP taking these medications    ibuprofen 200 MG tablet Commonly known as: ADVIL       TAKE these medications    amLODipine 10 MG tablet Commonly known as: NORVASC Take 10 mg by mouth daily.   aspirin EC 81 MG tablet Take 1 tablet (81 mg total) by mouth 2 (two) times daily. What changed:  when to take this additional instructions   cyanocobalamin 1000 MCG tablet Commonly known as: VITAMIN B12 Take 1,000 mcg by mouth daily.   hydrochlorothiazide 25 MG tablet Commonly known as: HYDRODIURIL Take 1 tablet by mouth daily.   olmesartan 20 MG tablet Commonly known as: BENICAR Take 20 mg by mouth daily.   ondansetron 4 MG tablet Commonly known as:  Zofran Take 1 tablet (4 mg total) by mouth daily as needed for nausea or vomiting.   oxyCODONE-acetaminophen 5-325 MG tablet Commonly known as: PERCOCET/ROXICET Take 1 tablet by mouth every 4 (four) hours as needed for severe pain (pain score 7-10).   potassium chloride SA 20 MEQ tablet Commonly known as: KLOR-CON M Take 1 tablet by mouth daily.   rosuvastatin 10 MG tablet Commonly known as: CRESTOR Take 10 mg by mouth daily.   tiZANidine 2 MG tablet Commonly known as: ZANAFLEX Take 1 tablet (2 mg total) by mouth every 6 (six) hours as needed for muscle spasms.               Durable Medical Equipment  (From admission, onward)           Start     Ordered   03/30/23 1754  DME Walker rolling  Once       Question:  Patient needs a walker to treat with the following condition  Answer:  Status post total left knee replacement   03/30/23 1753   03/30/23 1754  DME 3 n 1  Once        03/30/23 1753   03/30/23 1754  DME Walker rolling  Once       Question:  Patient needs a walker to treat with the following condition  Answer:  Status post right knee replacement   03/30/23 1753   03/30/23 1754  DME 3 n 1  Once        03/30/23 1753              Discharge Care Instructions  (From admission, onward)           Start     Ordered   03/30/23 0000  Weight bearing as tolerated        03/30/23 1255            Diagnostic Studies: DG Knee 1-2 Views Left Result Date: 03/30/2023 CLINICAL DATA:  Status post total knee arthroplasty. EXAM: LEFT KNEE - 1-2 VIEW COMPARISON:  None Available. FINDINGS: Left knee arthroplasty in expected alignment. No periprosthetic lucency or fracture. There has been patellar resurfacing. Recent postsurgical change includes air and edema in the soft tissues and joint space. IMPRESSION: Left knee arthroplasty without immediate postoperative complication. Electronically Signed   By: Narda Rutherford M.D.   On: 03/30/2023 15:13    Disposition:  Discharge disposition: 01-Home or Self Care  Discharge Instructions     Call MD / Call 911   Complete by: As directed    If you experience chest pain or shortness of breath, CALL 911 and be transported to the hospital emergency room.  If you develope a fever above 101 F, pus (white drainage) or increased drainage or redness at the wound, or calf pain, call your surgeon's office.   Constipation Prevention   Complete by: As directed    Drink plenty of fluids.  Prune juice may be helpful.  You may use a stool softener, such as Colace (over the counter) 100 mg twice a day.  Use MiraLax (over the counter) for constipation as needed.   Diet - low sodium heart healthy   Complete by: As directed    Diet - low sodium heart healthy   Complete by: As directed    Driving restrictions   Complete by: As directed    No driving for 2 weeks   Increase activity slowly   Complete by: As directed    Increase activity slowly as tolerated   Complete by: As directed    Patient may shower   Complete by: As directed    You may shower without a dressing once there is no drainage.  Do not wash over the wound.  If drainage remains, cover wound with plastic wrap and then shower.   Post-operative opioid taper instructions:   Complete by: As directed    POST-OPERATIVE OPIOID TAPER INSTRUCTIONS: It is important to wean off of your opioid medication as soon as possible. If you do not need pain medication after your surgery it is ok to stop day one. Opioids include: Codeine, Hydrocodone(Norco, Vicodin), Oxycodone(Percocet, oxycontin) and hydromorphone amongst others.  Long term and even short term use of opiods can cause: Increased pain response Dependence Constipation Depression Respiratory depression And more.  Withdrawal symptoms can include Flu like symptoms Nausea, vomiting And more Techniques to manage these symptoms Hydrate well Eat regular healthy meals Stay active Use relaxation  techniques(deep breathing, meditating, yoga) Do Not substitute Alcohol to help with tapering If you have been on opioids for less than two weeks and do not have pain than it is ok to stop all together.  Plan to wean off of opioids This plan should start within one week post op of your joint replacement. Maintain the same interval or time between taking each dose and first decrease the dose.  Cut the total daily intake of opioids by one tablet each day Next start to increase the time between doses. The last dose that should be eliminated is the evening dose.      Weight bearing as tolerated   Complete by: As directed         Follow-up Information     Luci Bank, MD Follow up in 2 week(s).   Specialty: Orthopedic Surgery Contact information: [address] [place] [address] [phone]                  Signed: Luci Bank 03/31/2023, 12:20 PM

## 2023-03-31 NOTE — Progress Notes (Signed)
PT TX NOTE   03/31/23 1240  PT Visit Information  Last PT Received On 03/31/23   Pt making great progress and meeting goals. Reviewed safety/mobility as below, importance of terminal knee extension, use of ice and reviewed stair technique for home entry today. Pt family present and supportive. Ready for d/c from PT standpoint with family assist as needed.   History of Present Illness 67 yo male presents to therapy s/p L TKA on 03/30/2023 due to failure of conservative measures. Pt PMH includes but is not limited to: anemia, chronic LBP, dysrhythmia, GERD, glaucoma, HTN, prostate ca, OSA, and lumbar surgery.  Subjective Data  Patient Stated Goal golf  Precautions  Precautions Knee;Fall  Restrictions  Weight Bearing Restrictions Per Provider Order No  Pain Assessment  Pain Assessment 0-10  Pain Score 3  Pain Location L knee  Pain Descriptors / Indicators Discomfort;Sore  Pain Intervention(s) Limited activity within patient's tolerance;Monitored during session;Premedicated before session  Cognition  Arousal Alert  Behavior During Therapy WFL for tasks assessed/performed  Overall Cognitive Status Within Functional Limits for tasks assessed  Bed Mobility  General bed mobility comments received in recliner  Transfers  Overall transfer level Needs assistance  Equipment used Rolling walker (2 wheels)  Transfers Sit to/from Stand  Sit to Stand Supervision;Modified independent (Device/Increase time)  General transfer comment cues for proper UE and LLE position  Ambulation/Gait  Ambulation/Gait assistance Supervision  Gait Distance (Feet) 60 Feet  Assistive device Rolling walker (2 wheels)  Gait Pattern/deviations Antalgic;Trunk flexed;Step-through pattern  General Gait Details step through with improved wt shift to LLE with incr distance  Gait velocity decreased  Stairs Yes  Stairs assistance Contact guard assist  Stair Management Step to pattern;Forwards;With walker  Number of Stairs 5   General stair comments cues for sequence and technique.  good stability, no LOB  Balance  Overall balance assessment Needs assistance  Sitting-balance support Feet supported  Sitting balance-Leahy Scale Good  Standing balance support During functional activity;Reliant on assistive device for balance  Standing balance-Leahy Scale Good  PT - End of Session  Equipment Utilized During Treatment Gait belt  Activity Tolerance Patient tolerated treatment well  Patient left with call bell/phone within reach;with chair alarm set;with family/visitor present;in chair  Nurse Communication Mobility status   PT - Assessment/Plan  PT Visit Diagnosis Unsteadiness on feet (R26.81);Other abnormalities of gait and mobility (R26.89);Muscle weakness (generalized) (M62.81);Difficulty in walking, not elsewhere classified (R26.2);Pain  Pain - Right/Left Left  Pain - part of body Knee;Leg  PT Frequency (ACUTE ONLY) 7X/week  Follow Up Recommendations Follow physician's recommendations for discharge plan and follow up therapies  Patient can return home with the following A little help with walking and/or transfers;A little help with bathing/dressing/bathroom;Assistance with cooking/housework;Assist for transportation;Help with stairs or ramp for entrance  PT equipment Rolling walker (2 wheels)  AM-PAC PT "6 Clicks" Mobility Outcome Measure (Version 2)  Help needed turning from your back to your side while in a flat bed without using bedrails? 4  Help needed moving from lying on your back to sitting on the side of a flat bed without using bedrails? 4  Help needed moving to and from a bed to a chair (including a wheelchair)? 3  Help needed standing up from a chair using your arms (e.g., wheelchair or bedside chair)? 3  Help needed to walk in hospital room? 3  Help needed climbing 3-5 steps with a railing?  3  6 Click Score 20  Consider Recommendation of Discharge  To: Home with no services  Progressive Mobility   What is the highest level of mobility based on the progressive mobility assessment? Level 4 (Walks with assist in room) - Balance while marching in place and cannot step forward and back - Complete  PT Goal Progression  Progress towards PT goals Progressing toward goals  Acute Rehab PT Goals  PT Goal Formulation With patient  Time For Goal Achievement 04/17/23  Potential to Achieve Goals Good  PT Time Calculation  PT Start Time (ACUTE ONLY) 1224  PT Stop Time (ACUTE ONLY) 1234  PT Time Calculation (min) (ACUTE ONLY) 10 min  PT General Charges  $$ ACUTE PT VISIT 1 Visit  PT Treatments  $Gait Training 8-22 mins

## 2023-03-31 NOTE — TOC Initial Note (Signed)
Transition of Care [facility]) - Initial/Assessment Note    Patient Details  Name: Paul Padilla MRN: [id number] Date of Birth: [birth year]  Transition [facility]) CM/SW Contact:    Adrian Prows, RN Phone Number: 03/31/2023, 10:06 AM  Clinical Narrative:                 Spoke w/ pt, spouse, and family in room; pt says he is from home and plans to return at d/c; orders received for RW and HHPT; pt says his PT has been arranged by MD's office and he has appt on Thursday; clarified w/ Dannielle Burn, PA this has been arranged by office; pt says he has been given RW per PT; pt agrees to receive 3-N-1; he does not have an agency preference; spoke w/ Vaughan Basta at [place]; 3-N-1 will be delivered to pt's room; no TOC needs.  Expected Discharge Plan: Home/Self Care Barriers to Discharge: No Barriers Identified   Patient Goals and CMS Choice Patient states their goals for this hospitalization and ongoing recovery are:: home CMS Medicare.gov Compare Post Acute Care list provided to:: Patient Choice offered to / list presented to : Patient [place] ownership interest in [facility].provided to:: Patient    Expected Discharge Plan and Services   Discharge Planning Services: CM Consult Post Acute Care Choice: Durable Medical Equipment Living arrangements for the past 2 months: Single Family Home Expected Discharge Date: 03/30/23               DME Arranged: 3-N-1 DME Agency: [employer] Date DME Agency Contacted: 03/31/23 Time DME Agency Contacted: [id number] Representative spoke with at DME Agency: [employer] HH Arranged: NA HH Agency: NA        Prior Living Arrangements/Services Living arrangements for the past 2 months: Single Family Home Lives with:: Spouse Patient language and need for interpreter reviewed:: Yes Do you feel safe going back to the place where you live?: Yes      Need for Family Participation in Patient Care: Yes (Comment) Care giver support system  in place?: Yes (comment) Current home services: DME (cane) Criminal Activity/Legal Involvement Pertinent to Current Situation/Hospitalization: No - Comment as needed  Activities of Daily Living   ADL Screening (condition at time of admission) Independently performs ADLs?: Yes (appropriate for developmental age) Is the patient deaf or have difficulty hearing?: No Does the patient have difficulty seeing, even when wearing glasses/contacts?: No Does the patient have difficulty concentrating, remembering, or making decisions?: No  Permission Sought/Granted   Permission granted to share information with : Yes, Verbal Permission Granted  Share Information with NAME: Case Manager     Permission granted to share info w Relationship: Cliffard Shintani (spouse) [phone]     Emotional Assessment Appearance:: Appears stated age Attitude/Demeanor/Rapport: Gracious Affect (typically observed): Accepting Orientation: : Oriented to Self, Oriented to Place, Oriented to  Time, Oriented to Situation Alcohol / Substance Use: Not Applicable Psych Involvement: No (comment)  Admission diagnosis:  Presence of total knee joint prosthesis, 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] Patient Active Problem List   Diagnosis Date Noted   Presence of total knee joint prosthesis, 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   Radiculopathy 06/15/2013   PCP:  Bailey Mech, PA-C Pharmacy:   [id number] - [place], [place] - [address] [place] [address] Phone: [phone] Fax: [phone]     Social Drivers of Health (SDOH) Social History: SDOH Screenings   Food Insecurity: No Food Insecurity (03/31/2023)  Recent Concern:  Food Insecurity - High Risk (02/17/2023)   Received from [facility]  Housing: Low Risk  (03/31/2023)  Transportation Needs: No Transportation Needs (03/31/2023)  Utilities: Not At Risk (03/31/2023)  Recent Concern: Utilities - Medium Risk (02/17/2023)   Received from  [facility]  Social Connections: Unknown (08/20/2021)   Received from [facility]  Tobacco Use: Low Risk  (03/30/2023)   SDOH Interventions: Food Insecurity Interventions: Intervention Not Indicated, Inpatient TOC Housing Interventions: Intervention Not Indicated, Inpatient TOC Transportation Interventions: Intervention Not Indicated, Inpatient TOC Utilities Interventions: Intervention Not Indicated, Inpatient TOC   Readmission Risk Interventions     No data to display

## 2023-03-31 NOTE — Progress Notes (Signed)
Patient discharged home, IV removed, discharge paperwork provided and explained to patient as well as patient's family, both patient and patient's family have verbalized understanding.

## 2023-04-02 ENCOUNTER — Encounter (HOSPITAL_COMMUNITY): Payer: Self-pay | Admitting: Orthopedic Surgery
